# Patient Record
Sex: Female | Born: 1990
Health system: Southern US, Community
[De-identification: ages and names within clinical notes are randomized; demographics above are authoritative.]

## PROBLEM LIST (undated history)

## (undated) DIAGNOSIS — E282 Polycystic ovarian syndrome: Secondary | ICD-10-CM

## (undated) DIAGNOSIS — E039 Hypothyroidism, unspecified: Secondary | ICD-10-CM

## (undated) DIAGNOSIS — T7840XA Allergy, unspecified, initial encounter: Secondary | ICD-10-CM

## (undated) HISTORY — PX: WISDOM TOOTH EXTRACTION: SHX21

## (undated) HISTORY — DX: Allergy, unspecified, initial encounter: T78.40XA

## (undated) HISTORY — DX: Polycystic ovarian syndrome: E28.2

## (undated) HISTORY — DX: Hypothyroidism, unspecified: E03.9

---

## 2017-04-12 ENCOUNTER — Emergency Department: Payer: BLUE CROSS/BLUE SHIELD

## 2017-04-12 ENCOUNTER — Emergency Department
Admission: EM | Admit: 2017-04-12 | Discharge: 2017-04-12 | Disposition: A | Discharge status: Home / Self Care | Payer: BLUE CROSS/BLUE SHIELD | Attending: Emergency Medicine | Admitting: Emergency Medicine

## 2017-04-12 ENCOUNTER — Other Ambulatory Visit: Payer: Self-pay

## 2017-04-12 DIAGNOSIS — R079 Chest pain, unspecified: Secondary | ICD-10-CM | POA: Insufficient documentation

## 2017-04-12 LAB — BASIC METABOLIC PANEL
Anion gap: 8 (ref 5–15)
BUN: 13 mg/dL (ref 6–20)
CO2: 26 mmol/L (ref 22–32)
Calcium: 9.4 mg/dL (ref 8.9–10.3)
Chloride: 105 mmol/L (ref 101–111)
Creatinine, Ser: 0.68 mg/dL (ref 0.44–1.00)
GFR calc Af Amer: >60 mL/min (ref >60)
GFR calc non Af Amer: >60 mL/min (ref >60)
Glucose, Bld: 108 mg/dL — ABNORMAL HIGH (ref 65–99)
Potassium: 3.5 mmol/L (ref 3.5–5.1)
Sodium: 139 mmol/L (ref 135–145)

## 2017-04-12 LAB — CBC
HCT: 40.4 % (ref 35.0–47.0)
Hemoglobin: 14.2 g/dL (ref 12.0–16.0)
MCH: 30.9 pg (ref 26.0–34.0)
MCHC: 35.1 g/dL (ref 32.0–36.0)
MCV: 88 fL (ref 80.0–100.0)
Platelets: 250 10*3/uL (ref 150–440)
RBC: 4.59 MIL/uL (ref 3.80–5.20)
RDW: 12.4 % (ref 11.5–14.5)
WBC: 7.3 10*3/uL (ref 3.6–11.0)

## 2017-04-12 LAB — TROPONIN I: Troponin I: <0.03 ng/mL (ref <0.03)

## 2017-04-12 MED ORDER — DIAZEPAM 5 MG PO TABS: 5.0000 mg | ORAL_TABLET | Freq: Three times a day (TID) | ORAL | 0 refills | Status: DC | PRN

## 2017-04-12 NOTE — ED Triage Notes (Signed)
Patient reports having off/on "funny" feeling in chest for couple days.  Reports tonight with left upper chest pain, described as sore, denies radiation, or shortness of breath or nausea.  Also reports left thigh feels tingling/tense.

## 2017-04-12 NOTE — ED Notes (Signed)
Dr. Mayford Knife at bedside with pt

## 2017-04-12 NOTE — ED Provider Notes (Addendum)
[facility] Emergency Department Provider Note       Time seen: ----------------------------------------- 7:33 AM on 04/12/2017 -----------------------------------------     I have reviewed the triage vital signs and the nursing notes.   HISTORY   Chief Complaint Chest Pain    HPI Cassandra Rojas is a 26 y.o. female who presents to the ED for funny feeling in her chest for several days. Patient reports tonight she had some left upper chest pain that she describes as soreness. She denies any other symptoms such as shortness of breath or nausea. Still felt some tingling in her left thigh. She denies fevers, chills or other complaints.  No past medical history on file.  There are no active problems to display for this patient.   No past surgical history on file.  Allergies Amoxapine and related  Social History Social History  Substance Use Topics  . Smoking status: Not on file  . Smokeless tobacco: Not on file  . Alcohol use Not on file    Review of Systems Constitutional: Negative for fever. Eyes: Negative for vision changes ENT:  Negative for congestion, sore throat Cardiovascular: positive for chest soreness Respiratory: Negative for shortness of breath. Gastrointestinal: Negative for abdominal pain, vomiting and diarrhea. Genitourinary: Negative for dysuria. Musculoskeletal: positive for left thigh soreness Skin: Negative for rash. Neurological: Negative for headaches, focal weakness or numbness.  All systems negative/normal/unremarkable except as stated in the HPI  ____________________________________________   PHYSICAL EXAM:  VITAL SIGNS: ED Triage Vitals  Enc Vitals Group     BP 04/12/17 0427 (!) 135/93     Pulse Rate 04/12/17 0427 94     Resp 04/12/17 0427 14     Temp 04/12/17 0427 98.5 F (36.9 C)     Temp Source 04/12/17 0427 Oral     SpO2 04/12/17 0427 100 %     Weight 04/12/17 0427 117 lb (53.1 kg)      Height 04/12/17 0427  (1.727 m)     Head Circumference --      Peak Flow --      Pain Score 04/12/17 0429 3     Pain Loc --      Pain Edu? --      Excl. in GC? --     Constitutional: Alert and oriented. Well appearing and in no distress. Eyes: Conjunctivae are normal. Normal extraocular movements. ENT   Head: Normocephalic and atraumatic.   Nose: No congestion/rhinnorhea.   Mouth/Throat: Mucous membranes are moist.   Neck: No stridor. Cardiovascular: Normal rate, regular rhythm. No murmurs, rubs, or gallops. Respiratory: Normal respiratory effort without tachypnea nor retractions. Breath sounds are clear and equal bilaterally. No wheezes/rales/rhonchi. Gastrointestinal: Soft and nontender. Normal bowel sounds Musculoskeletal: Nontender with normal range of motion in extremities. No lower extremity tenderness nor edema. Neurologic:  Normal speech and language. No gross focal neurologic deficits are appreciated.  Skin:  Skin is warm, dry and intact. No rash noted. Psychiatric: Mood and affect are normal. Speech and behavior are normal.  ____________________________________________  EKG: Interpreted by me.sinus rhythm with a rate of 90 bpm, normal QRS, normal QT, nonspecific T-wave changes  repeat EKG with sinus rhythm, rate is 71 bpm, short PR interval, normal QRS, normal QT.  ____________________________________________  ED COURSE:  Pertinent labs & imaging results that were available during my care of the patient were reviewed by me and considered in my medical decision making (see chart for details). Patient presents for nonspecific chest pain,  we will assess with labs and imaging as indicated.   Procedures ____________________________________________   LABS (pertinent positives/negatives)  Labs Reviewed  BASIC METABOLIC PANEL - Abnormal; Notable for the following:       Result Value   Glucose, Bld 108 (*)    All other components within normal limits   CBC  TROPONIN I    RADIOLOGY Images were viewed by me  chest x-ray is normal  ____________________________________________  DIFFERENTIAL DIAGNOSIS   chest wall strain, costochondritis, anxiety, GERD, angina   FINAL ASSESSMENT AND PLAN  chest pain   Plan: Patient's labs and imaging were dictated above. Patient had presented for chest pain which seems to be musculoskeletal or anxiety in origin. Her EKG is slightly abnormal but she has no risk factors for heart disease. She will be encouraged to have outpatient follow-up with her doctor and I'll prescribe Valium for anxiety and muscle relaxants.   Emily Filbert, MD   Note: This note was generated in part or whole with voice recognition software. Voice recognition is usually quite accurate but there are transcription errors that can and very often do occur. I apologize for any typographical errors that were not detected and corrected.     Emily Filbert, MD 04/12/17 1610    Emily Filbert, MD 04/12/17 [phone]

## 2018-08-05 IMAGING — CR DG CHEST 2V
1 series · 2 of 2 positions shown · non-contrast
Comparison: None.

CLINICAL DATA: Chest pain

EXAM:
CHEST  2 VIEW

[Series 1: dg chest 2 view · 0.14mm/px · 2 of 2 slices shown]
[im 1/2]
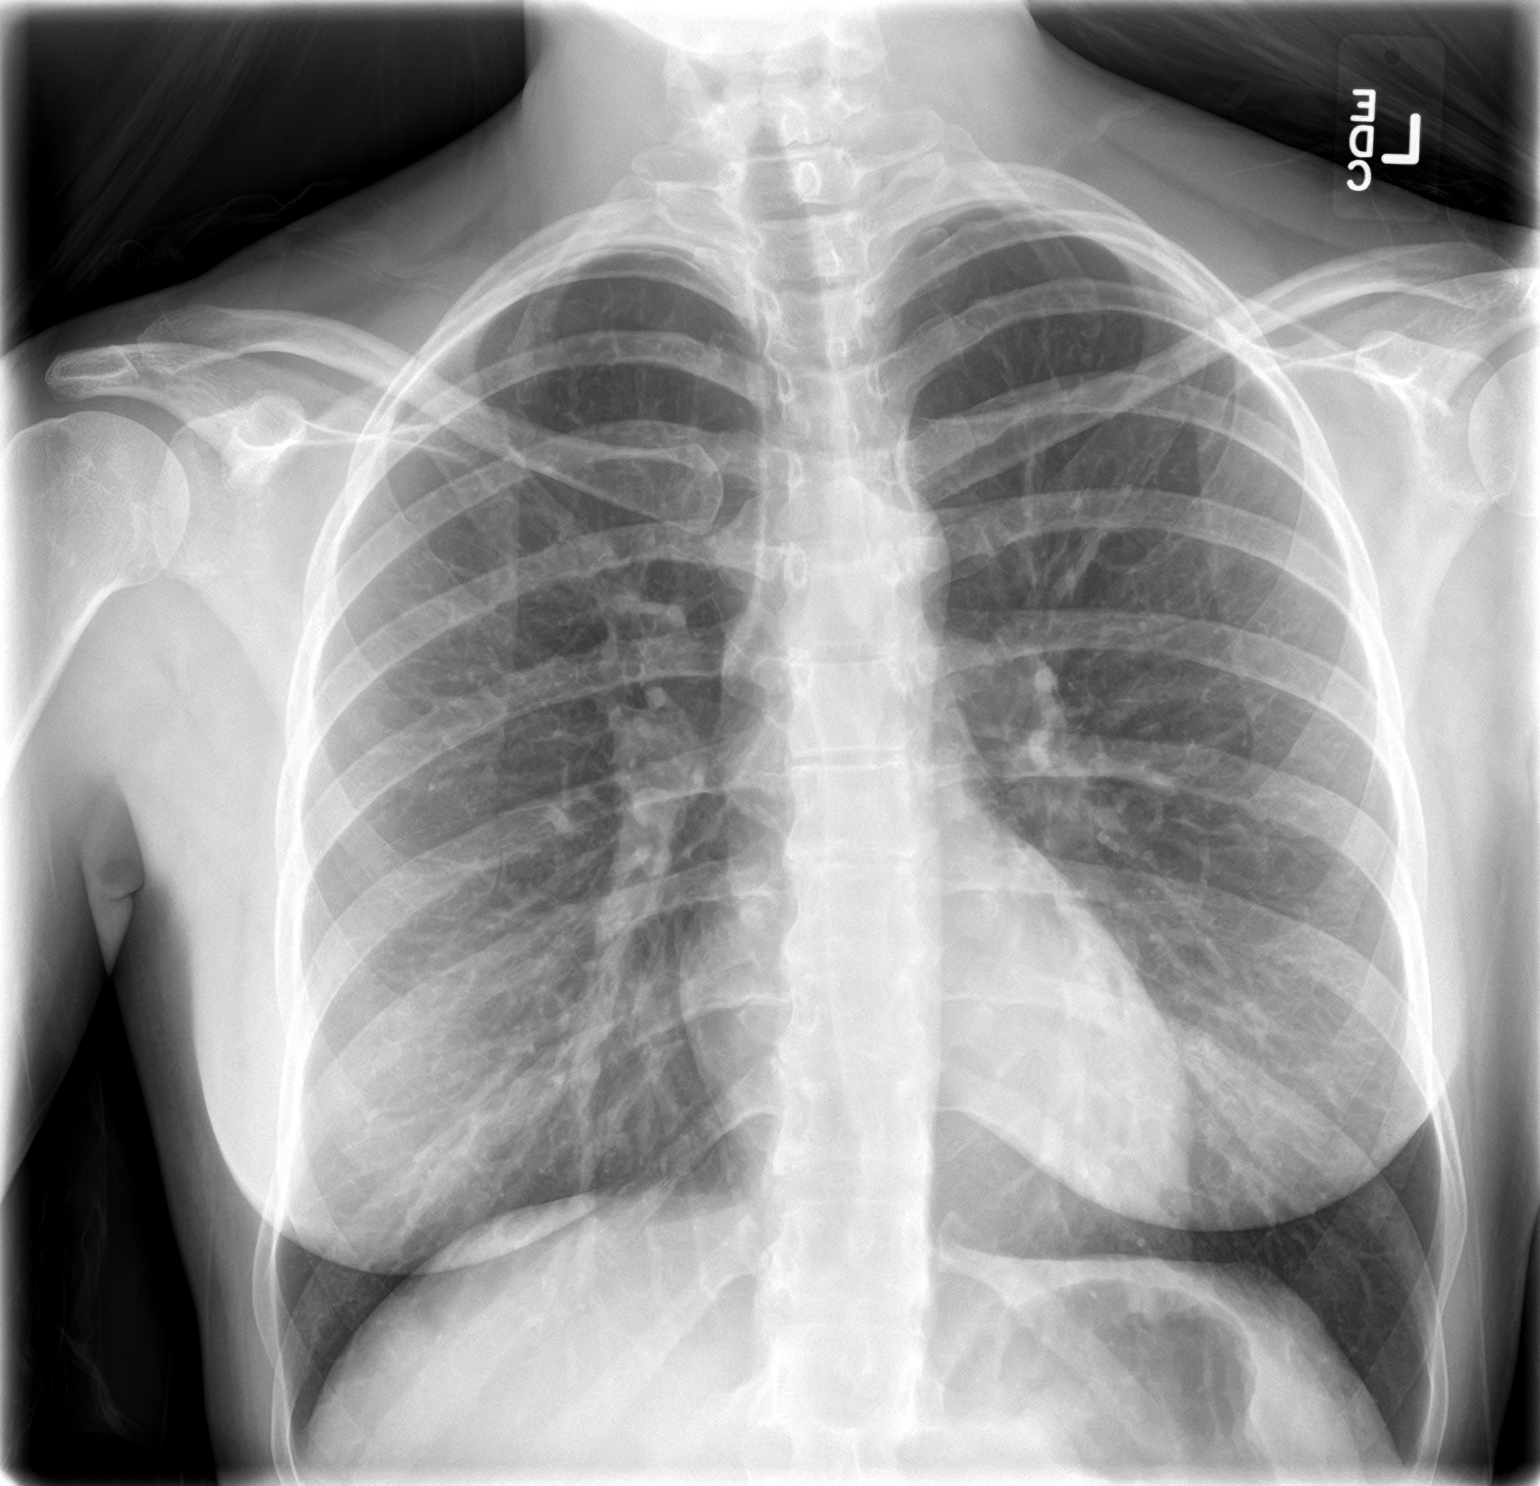
[im 2/2]
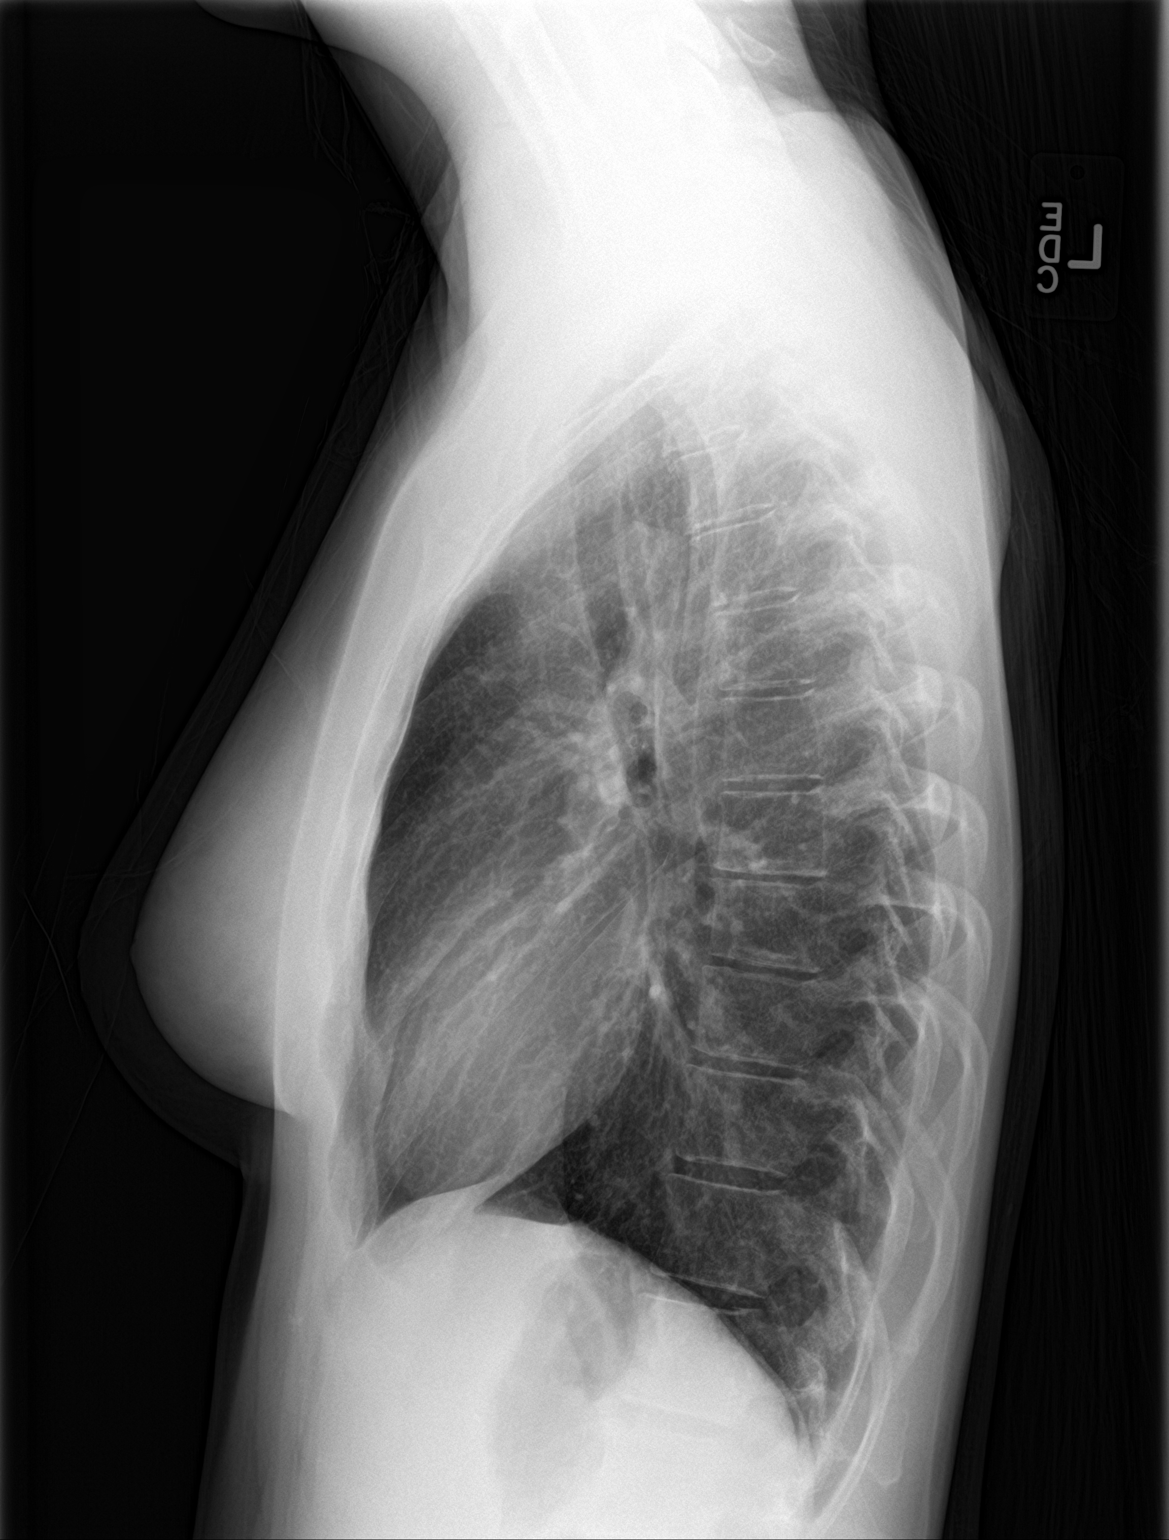

[2 of 2 positions shown; findings below may reference images not displayed]

FINDINGS: The heart size and mediastinal contours are within normal limits.
Both lungs are clear. The visualized skeletal structures are
unremarkable.
IMPRESSION: No active cardiopulmonary disease.

## 2018-09-10 DIAGNOSIS — D229 Melanocytic nevi, unspecified: Secondary | ICD-10-CM | POA: Diagnosis not present

## 2018-09-16 DIAGNOSIS — D2261 Melanocytic nevi of right upper limb, including shoulder: Secondary | ICD-10-CM | POA: Diagnosis not present

## 2018-09-16 DIAGNOSIS — D2262 Melanocytic nevi of left upper limb, including shoulder: Secondary | ICD-10-CM | POA: Diagnosis not present

## 2018-09-16 DIAGNOSIS — R208 Other disturbances of skin sensation: Secondary | ICD-10-CM | POA: Diagnosis not present

## 2018-09-16 DIAGNOSIS — D225 Melanocytic nevi of trunk: Secondary | ICD-10-CM | POA: Diagnosis not present

## 2018-09-16 DIAGNOSIS — D2272 Melanocytic nevi of left lower limb, including hip: Secondary | ICD-10-CM | POA: Diagnosis not present

## 2018-12-09 DIAGNOSIS — Z6822 Body mass index (BMI) 22.0-22.9, adult: Secondary | ICD-10-CM | POA: Diagnosis not present

## 2018-12-09 DIAGNOSIS — Z124 Encounter for screening for malignant neoplasm of cervix: Secondary | ICD-10-CM | POA: Diagnosis not present

## 2018-12-09 DIAGNOSIS — Z130 Encounter for screening for diseases of the blood and blood-forming organs and certain disorders involving the immune mechanism: Secondary | ICD-10-CM | POA: Diagnosis not present

## 2018-12-09 DIAGNOSIS — Z01419 Encounter for gynecological examination (general) (routine) without abnormal findings: Secondary | ICD-10-CM | POA: Diagnosis not present

## 2018-12-09 DIAGNOSIS — Z3044 Encounter for surveillance of vaginal ring hormonal contraceptive device: Secondary | ICD-10-CM | POA: Diagnosis not present

## 2019-05-28 DIAGNOSIS — Z20828 Contact with and (suspected) exposure to other viral communicable diseases: Secondary | ICD-10-CM | POA: Diagnosis not present

## 2019-11-10 ENCOUNTER — Ambulatory Visit (INDEPENDENT_AMBULATORY_CARE_PROVIDER_SITE_OTHER): Payer: BLUE CROSS/BLUE SHIELD | Admitting: Adult Health

## 2019-11-10 ENCOUNTER — Other Ambulatory Visit: Payer: Self-pay

## 2019-11-10 ENCOUNTER — Encounter: Payer: Self-pay | Admitting: Adult Health

## 2019-11-10 VITALS — BP 108/80 | HR 82 | Temp 96.6°F | Resp 16 | Ht 62.0 in | Wt 120.6 lb

## 2019-11-10 DIAGNOSIS — Z8639 Personal history of other endocrine, nutritional and metabolic disease: Secondary | ICD-10-CM | POA: Insufficient documentation

## 2019-11-10 DIAGNOSIS — Z0000 Encounter for general adult medical examination without abnormal findings: Secondary | ICD-10-CM | POA: Diagnosis not present

## 2019-11-10 DIAGNOSIS — E559 Vitamin D deficiency, unspecified: Secondary | ICD-10-CM | POA: Diagnosis not present

## 2019-11-10 DIAGNOSIS — Z91018 Allergy to other foods: Secondary | ICD-10-CM | POA: Diagnosis not present

## 2019-11-10 DIAGNOSIS — G5602 Carpal tunnel syndrome, left upper limb: Secondary | ICD-10-CM

## 2019-11-10 MED ORDER — EPINEPHRINE 0.3 MG/0.3ML IJ SOAJ: 0.3000 mg | INTRAMUSCULAR | 1 refills | Status: DC | PRN

## 2019-11-10 NOTE — Progress Notes (Signed)
New patient visit   Patient: Cassandra Rojas   DOB: Nov 06, 1990   29 y.o. Female  MRN: [id number] Visit Date: 11/10/2019  Today's healthcare provider: Jairo Ben, FNP   Chief Complaint  Patient presents with  . New Patient (Initial Visit)   Subjective    Cassandra Rojas is a 29 y.o. female who presents today as a new patient to establish care.  HPI Patient comes in office today to establish care she states that she has not had a previous PCP before. Patient states that she feels well today but would like to readdress pain in her left wrist and upper arm that has been occurring intermittent for the past year and half.   Patient reports that she has been seen at urgent care for pain before and was diagnosed with carpal tunnel syndrome, patient complains of sharp shooting pain and numbness in her forearm when extending left arm out.  Patient reports that she follows a well balanced diet and is staying active exercising up to 3x a week and reports sleeping fairly well. She has pain in her posterior forearm, and sometimes tingling in her fingers.   She is active in yoga.  She also does [employer] however she had this before.  Denies any injury or known trauma.   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  Patient reports that she sees [facility] for her gynecology exams and reports that her last pap was normal (2020), no history of abnormal. She had breast exam at her gynecology.   She had Covid Mcderma immunization, and she had a small skin lump near her left side of neck. She feels as if it has resolved. She has had both vaccine. Last dose was April 1st.    She has a nut allergy to cashews, no anaphylaxis but has had throat itching. Mouth feels on fire. She did have some mild throat swelling in the past.   Denies any presyncope or syncopal. She has had diagnosis of PCOS in past. She has been on oral birth control. She has been on thyroid  medication in past and stopped it . She has not has not had a PCP since college.  Patient  denies any fever, body aches,chills, rash, chest pain, shortness of breath, nausea, vomiting, or diarrhea.   Declines STD testing or labs for STD's.  Past Medical History:  Diagnosis Date  . Allergy   . Hypothyroid   . PCOS (polycystic ovarian syndrome)    Family Status  Relation Name Status  . Mother  Alive  . Father  Alive  . Brother  Alive  . PGM  (Not Specified)  . PGF  (Not Specified)   Family History  Problem Relation Age of Onset  . Cataracts Mother   . Hypercholesterolemia Father   . Hypertension Father   . Allergies Brother   . Heart disease Paternal Grandmother   . Heart disease Paternal Grandfather   . Parkinson's disease Paternal Grandfather    Social History   Socioeconomic History  . Marital status: Married    Spouse name: Not on file  . Number of children: Not on file  . Years of education: Not on file  . Highest education level: Not on file  Occupational History  . Not on file  Tobacco Use  . Smoking status: Never Smoker  . Smokeless tobacco: Never Used  Substance and Sexual Activity  . Alcohol use: Yes  Alcohol/week: 4.0 standard drinks    Types: 1 Glasses of wine, 2 Cans of beer, 1 Shots of liquor per week  . Drug use: Never  . Sexual activity: Yes    Partners: Male    Birth control/protection: Other-see comments    Comment: NuvaRing  Other Topics Concern  . Not on file  Social History Narrative  . Not on file   Social Determinants of Health   Financial Resource Strain:   . Difficulty of Paying Living Expenses:   Food Insecurity:   . Worried About Programme researcher, broadcasting/film/videounning Out of Food in the Last Year:   . Baristaan Out of Food in the Last Year:   Transportation Needs:   . Freight forwarderLack of Transportation (Medical):   Marland Kitchen. Lack of Transportation (Non-Medical):   Physical Activity:   . Days of Exercise per Week:   . Minutes of Exercise per Session:   Stress:   . Feeling of  Stress :   Social Connections:   . Frequency of Communication with Friends and Family:   . Frequency of Social Gatherings with Friends and Family:   . Attends Religious Services:   . Active Member of Clubs or Organizations:   . Attends BankerClub or Organization Meetings:   Marland Kitchen. Marital Status:    Outpatient Medications Prior to Visit  Medication Sig  . etonogestrel-ethinyl estradiol (NUVARING) 0.12-0.015 MG/24HR vaginal ring Place 1 each vaginally every 28 (twenty-eight) days. Insert vaginally and leave in place for 3 consecutive weeks, then remove for 1 week.  . [DISCONTINUED] diazepam (VALIUM) 5 MG tablet Take 1 tablet (5 mg total) by mouth every 8 (eight) hours as needed for muscle spasms.   No facility-administered medications prior to visit.   Allergies  Allergen Reactions  . Other Cough    Mold* Cashews & pistachios patient reports swelling of throat  . Amoxapine And Related Other (See Comments)    Unsure of reaction, occurred as a child  . Amoxicillin     Patient reports childhood allergy     There is no immunization history on file for this patient.  Health Maintenance  Topic Date Due  . HIV Screening  Never done  . TETANUS/TDAP  Never done  . PAP SMEAR-Modifier  Never done  . INFLUENZA VACCINE  02/05/2020    Patient Care Team: Berniece PapFlinchum, Lemarcus Baggerly S, FNP as PCP - General (Family Medicine)  Review of Systems  Musculoskeletal: Positive for myalgias and neck pain.  Allergic/Immunologic: Positive for environmental allergies and food allergies.  Neurological: Positive for numbness.  All other systems reviewed and are negative.  Last CBC Lab Results  Component Value Date   WBC 7.3 04/12/2017   HGB 14.2 04/12/2017   HCT 40.4 04/12/2017   MCV 88.0 04/12/2017   MCH 30.9 04/12/2017   RDW 12.4 04/12/2017   PLT 250 04/12/2017   Last metabolic panel Lab Results  Component Value Date   GLUCOSE 108 (H) 04/12/2017   NA 139 04/12/2017   K 3.5 04/12/2017   CL 105  04/12/2017   CO2 26 04/12/2017   BUN 13 04/12/2017   CREATININE 0.68 04/12/2017   GFRNONAA >60 04/12/2017   GFRAA >60 04/12/2017   CALCIUM 9.4 04/12/2017   ANIONGAP 8 04/12/2017   Last lipids No results found for: CHOL, HDL, LDLCALC, LDLDIRECT, TRIG, CHOLHDL Last hemoglobin A1c No results found for: HGBA1C Last thyroid functions No results found for: TSH, T3TOTAL, T4TOTAL, THYROIDAB Last vitamin D No results found for: 25OHVITD2, 25OHVITD3, VD25OH Last vitamin B12 and Folate  No results found for: VITAMINB12, FOLATE    Objective    BP 108/80   Pulse 82   Temp (!) 96.6 F (35.9 C) (Oral)   Resp 16   Ht 5\' 2"  (1.575 m)   Wt 120 lb 9.6 oz (54.7 kg)   SpO2 98%   BMI 22.06 kg/m  Physical Exam Vitals reviewed.  Constitutional:      General: She is not in acute distress.    Appearance: Normal appearance. She is well-developed and normal weight. She is not ill-appearing, toxic-appearing or diaphoretic.     Interventions: She is not intubated. HENT:     Head: Normocephalic and atraumatic.     Right Ear: Tympanic membrane, ear canal and external ear normal.     Left Ear: Tympanic membrane, ear canal and external ear normal.     Nose: Nose normal.     Mouth/Throat:     Mouth: Mucous membranes are moist.     Pharynx: No oropharyngeal exudate or posterior oropharyngeal erythema.  Eyes:     General: Lids are normal. No scleral icterus.       Right eye: No discharge.        Left eye: No discharge.     Conjunctiva/sclera: Conjunctivae normal.     Right eye: Right conjunctiva is not injected. No exudate or hemorrhage.    Left eye: Left conjunctiva is not injected. No exudate or hemorrhage.    Pupils: Pupils are equal, round, and reactive to light.  Neck:     Thyroid: No thyroid mass or thyromegaly.     Vascular: Normal carotid pulses. No carotid bruit, hepatojugular reflux or JVD.     Trachea: Trachea and phonation normal. No tracheal tenderness or tracheal deviation.      Meningeal: Brudzinski's sign and Kernig's sign absent.  Cardiovascular:     Rate and Rhythm: Normal rate and regular rhythm.     Pulses: Normal pulses.          Radial pulses are 2+ on the right side and 2+ on the left side.       Dorsalis pedis pulses are 2+ on the right side and 2+ on the left side.       Posterior tibial pulses are 2+ on the right side and 2+ on the left side.     Heart sounds: Normal heart sounds, S1 normal and S2 normal. Heart sounds not distant. No murmur. No friction rub. No gallop.   Pulmonary:     Effort: Pulmonary effort is normal. No tachypnea, bradypnea, accessory muscle usage or respiratory distress. She is not intubated.     Breath sounds: Normal breath sounds. No stridor. No wheezing, rhonchi or rales.  Chest:     Chest wall: No tenderness.  Abdominal:     General: Bowel sounds are normal. There is no distension or abdominal bruit.     Palpations: Abdomen is soft. There is no shifting dullness, fluid wave, hepatomegaly, splenomegaly, mass or pulsatile mass.     Tenderness: There is no abdominal tenderness. There is no right CVA tenderness, left CVA tenderness, guarding or rebound.     Hernia: No hernia is present.  Musculoskeletal:        General: No tenderness or deformity. Normal range of motion.     Right wrist: No tenderness. Normal range of motion.     Left wrist: No tenderness. Normal range of motion.     Cervical back: Full passive range of motion without pain, normal range of  motion and neck supple. No edema, erythema or rigidity. No spinous process tenderness or muscular tenderness. Normal range of motion.     Comments: Positive tinel's and Phalen test.   Lymphadenopathy:     Head:     Right side of head: No submental, submandibular, tonsillar, preauricular, posterior auricular or occipital adenopathy.     Left side of head: No submental, submandibular, tonsillar, preauricular, posterior auricular or occipital adenopathy.     Cervical: No cervical  adenopathy.     Right cervical: No superficial, deep or posterior cervical adenopathy.    Left cervical: No superficial, deep or posterior cervical adenopathy.     Upper Body:     Right upper body: No supraclavicular or pectoral adenopathy.     Left upper body: No supraclavicular or pectoral adenopathy.  Skin:    General: Skin is warm and dry.     Capillary Refill: Capillary refill takes less than 2 seconds.     Coloration: Skin is not jaundiced or pale.     Findings: No abrasion, bruising, burn, ecchymosis, erythema, lesion, petechiae or rash.     Nails: There is no clubbing.  Neurological:     Mental Status: She is alert and oriented to person, place, and time.     GCS: GCS eye subscore is 4. GCS verbal subscore is 5. GCS motor subscore is 6.     Cranial Nerves: No cranial nerve deficit.     Sensory: No sensory deficit.     Motor: No tremor, atrophy, abnormal muscle tone or seizure activity.     Coordination: Coordination normal.     Gait: Gait normal.     Deep Tendon Reflexes: Reflexes are normal and symmetric. Reflexes normal. Babinski sign absent on the right side. Babinski sign absent on the left side.     Reflex Scores:      Tricep reflexes are 2+ on the right side and 2+ on the left side.      Bicep reflexes are 2+ on the right side and 2+ on the left side.      Brachioradialis reflexes are 2+ on the right side and 2+ on the left side.      Patellar reflexes are 2+ on the right side and 2+ on the left side.      Achilles reflexes are 2+ on the right side and 2+ on the left side. Psychiatric:        Mood and Affect: Mood normal.        Speech: Speech normal.        Behavior: Behavior normal.        Thought Content: Thought content normal.        Judgment: Judgment normal.    Patient appers well, not sickly. Speaking in complete sentences. Patient moves on and off of exam table and in room without difficulty. Gait is normal in hall and in room. Patient is oriented to person  place time and situation. Patient answers questions appropriately and engages eye contact and verbal dialect with provider.  Depression Screen PHQ 2/9 Scores 11/10/2019  PHQ - 2 Score 2  PHQ- 9 Score 9   No results found for any visits on 11/10/19.  Assessment & Plan     Routine health maintenance - Plan: CBC with Differential/Platelet, Comprehensive metabolic panel, TSH, Lipid panel, POCT urinalysis dipstick  History of hypothyroidism  Allergy to cashew nut - Plan: EPINEPHrine 0.3 mg/0.3 mL IJ SOAJ injection  Allergy to nuts - Plan: EPINEPHrine 0.3  mg/0.3 mL IJ SOAJ injection  Vitamin D deficiency - Plan: VITAMIN D 25 Hydroxy (Vit-D Deficiency, Fractures)  Carpal tunnel syndrome of left wrist  Meds ordered this encounter  Medications  . EPINEPHrine 0.3 mg/0.3 mL IJ SOAJ injection    Sig: Inject 0.3 mLs (0.3 mg total) into the muscle as needed for anaphylaxis.    Dispense:  1 each    Refill:  1     Discussed carpal tunnel stretches, brace at night and can refer to hand specialist. She prefers conservative measures at this time and will try the above and NSAID as needed per package.   Return in 1 year (on 11/09/2020), or if symptoms worsen or fail to improve, for at any time for any worsening symptoms, Go to Emergency room/ urgent care if worse.  Advised patient call the office or your primary care doctor for an appointment if no improvement within 72 hours or if any symptoms change or worsen at any time  Advised ER or urgent Care if after hours or on weekend. Call 911 for emergency symptoms at any time.Patinet verbalized understanding of all instructions given/reviewed and treatment plan and has no further questions or concerns at this time.    Fasting labs.  Epinephrine pen discussed and signs of anaphylaxis  How to use  and calling 911 if used advised.  Notify office if used. Will have pharmacist demonstrate as well when she picks up medication. Epinephrine -pen video discussed.   Return if symptoms worsen or fail to improve.    Advised patient call the office or your primary care doctor for an appointment if no improvement within 72 hours or if any symptoms change or worsen at any time  Advised ER or urgent Care if after hours or on weekend. Call 911 for emergency symptoms at any time.Patinet verbalized understanding of all instructions given/reviewed and treatment plan and has no further questions or concerns at this time.    IBeverely Pace Maryelizabeth Eberle, FNP, have reviewed all documentation for this visit. The documentation on 11/11/19 for the exam, diagnosis, procedures, and orders are all accurate and complete.   Jairo Ben, FNP  [facility] [phone] (phone) [phone] (fax)  [facility]

## 2019-11-10 NOTE — Patient Instructions (Addendum)
Health Maintenance, Female Adopting a healthy lifestyle and getting preventive care are important in promoting health and wellness. Ask your health care provider about:  The right schedule for you to have regular tests and exams.  Things you can do on your own to prevent diseases and keep yourself healthy. What should I know about diet, weight, and exercise? Eat a healthy diet   Eat a diet that includes plenty of vegetables, fruits, low-fat dairy products, and lean protein.  Do not eat a lot of foods that are high in solid fats, added sugars, or sodium. Maintain a healthy weight Body mass index (BMI) is used to identify weight problems. It estimates body fat based on height and weight. Your health care provider can help determine your BMI and help you achieve or maintain a healthy weight. Get regular exercise Get regular exercise. This is one of the most important things you can do for your health. Most adults should:  Exercise for at least 150 minutes each week. The exercise should increase your heart rate and make you sweat (moderate-intensity exercise).  Do strengthening exercises at least twice a week. This is in addition to the moderate-intensity exercise.  Spend less time sitting. Even light physical activity can be beneficial. Watch cholesterol and blood lipids Have your blood tested for lipids and cholesterol at 31 years of age, then have this test every 5 years. Have your cholesterol levels checked more often if:  Your lipid or cholesterol levels are high.  You are older than 31 years of age.  You are at high risk for heart disease. What should I know about cancer screening? Depending on your health history and family history, you may need to have cancer screening at various ages. This may include screening for:  Breast cancer.  Cervical cancer.  Colorectal cancer.  Skin cancer.  Lung cancer. What should I know about heart disease, diabetes, and high blood  pressure? Blood pressure and heart disease  High blood pressure causes heart disease and increases the risk of stroke. This is more likely to develop in people who have high blood pressure readings, are of African descent, or are overweight.  Have your blood pressure checked: ? Every 3-5 years if you are 18-39 years of age. ? Every year if you are 40 years old or older. Diabetes Have regular diabetes screenings. This checks your fasting blood sugar level. Have the screening done:  Once every three years after age 40 if you are at a normal weight and have a low risk for diabetes.  More often and at a younger age if you are overweight or have a high risk for diabetes. What should I know about preventing infection? Hepatitis B If you have a higher risk for hepatitis B, you should be screened for this virus. Talk with your health care provider to find out if you are at risk for hepatitis B infection. Hepatitis C Testing is recommended for:  Everyone born from 1945 through 1965.  Anyone with known risk factors for hepatitis C. Sexually transmitted infections (STIs)  Get screened for STIs, including gonorrhea and chlamydia, if: ? You are sexually active and are younger than 31 years of age. ? You are older than 31 years of age and your health care provider tells you that you are at risk for this type of infection. ? Your sexual activity has changed since you were last screened, and you are at increased risk for chlamydia or gonorrhea. Ask your health care provider if   you are at risk.  Ask your health care provider about whether you are at high risk for HIV. Your health care provider may recommend a prescription medicine to help prevent HIV infection. If you choose to take medicine to prevent HIV, you should first get tested for HIV. You should then be tested every 3 months for as long as you are taking the medicine. Pregnancy  If you are about to stop having your period (premenopausal) and  you may become pregnant, seek counseling before you get pregnant.  Take 400 to 800 micrograms (mcg) of folic acid every day if you become pregnant.  Ask for birth control (contraception) if you want to prevent pregnancy. Osteoporosis and menopause Osteoporosis is a disease in which the bones lose minerals and strength with aging. This can result in bone fractures. If you are 29 years old or older, or if you are at risk for osteoporosis and fractures, ask your health care provider if you should:  Be screened for bone loss.  Take a calcium or vitamin D supplement to lower your risk of fractures.  Be given hormone replacement therapy (HRT) to treat symptoms of menopause. Follow these instructions at home: Lifestyle  Do not use any products that contain nicotine or tobacco, such as cigarettes, e-cigarettes, and chewing tobacco. If you need help quitting, ask your health care provider.  Do not use street drugs.  Do not share needles.  Ask your health care provider for help if you need support or information about quitting drugs. Alcohol use  Do not drink alcohol if: ? Your health care provider tells you not to drink. ? You are pregnant, may be pregnant, or are planning to become pregnant.  If you drink alcohol: ? Limit how much you use to 0-1 drink a day. ? Limit intake if you are breastfeeding.  Be aware of how much alcohol is in your drink. In the [place] one drink equals one 12 oz bottle of beer (355 mL), one 5 oz glass of wine (148 mL), or one 1 oz glass of hard liquor (44 mL). General instructions  Schedule regular health, dental, and eye exams.  Stay current with your vaccines.  Tell your health care provider if: ? You often feel depressed. ? You have ever been abused or do not feel safe at home. Summary  Adopting a healthy lifestyle and getting preventive care are important in promoting health and wellness.  Follow your health care provider's instructions about healthy  diet, exercising, and getting tested or screened for diseases.  Follow your health care provider's instructions on monitoring your cholesterol and blood pressure. This information is not intended to replace advice given to you by your health care provider. Make sure you discuss any questions you have with your health care provider. Document Revised: 06/16/2018 Document Reviewed: 06/16/2018 Elsevier Patient Education  2020 [employer]  Breast Self-Awareness Breast self-awareness is knowing how your breasts look and feel. Doing breast self-awareness is important. It allows you to catch a breast problem early while it is still small and can be treated. All women should do breast self-awareness, including women who have had breast implants. Tell your doctor if you notice a change in your breasts. What you need:  A mirror.  A well-lit room. How to do a breast self-exam A breast self-exam is one way to learn what is normal for your breasts and to check for changes. To do a breast self-exam: Look for changes  1. Take off all the  clothes above your waist. 2. Stand in front of a mirror in a room with good lighting. 3. Put your hands on your hips. 4. Push your hands down. 5. Look at your breasts and nipples in the mirror to see if one breast or nipple looks different from the other. Check to see if: ? The shape of one breast is different. ? The size of one breast is different. ? There are wrinkles, dips, and bumps in one breast and not the other. 6. Look at each breast for changes in the skin, such as: ? Redness. ? Scaly areas. 7. Look for changes in your nipples, such as: ? Liquid around the nipples. ? Bleeding. ? Dimpling. ? Redness. ? A change in where the nipples are. Feel for changes  1. Lie on your back on the floor. 2. Feel each breast. To do this, follow these steps: ? Pick a breast to feel. ? Put the arm closest to that breast above your head. ? Use your other arm to feel  the nipple area of your breast. Feel the area with the pads of your three middle fingers by making small circles with your fingers. For the first circle, press lightly. For the second circle, press harder. For the third circle, press even harder. ? Keep making circles with your fingers at the different pressures as you move down your breast. Stop when you feel your ribs. ? Move your fingers a little toward the center of your body. ? Start making circles with your fingers again, this time going up until you reach your collarbone. ? Keep making up-and-down circles until you reach your armpit. Remember to keep using the three pressures. ? Feel the other breast in the same way. 3. Sit or stand in the tub or shower. 4. With soapy water on your skin, feel each breast the same way you did in step 2 when you were lying on the floor. Write down what you find Writing down what you find can help you remember what to tell your doctor. Write down:  What is normal for each breast.  Any changes you find in each breast, including: ? The kind of changes you find. ? Whether you have pain. ? Size and location of any lumps.  When you last had your menstrual period. General tips  Check your breasts every month.  If you are breastfeeding, the best time to check your breasts is after you feed your baby or after you use a breast pump.  If you get menstrual periods, the best time to check your breasts is 5-7 days after your menstrual period is over.  With time, you will become comfortable with the self-exam, and you will begin to know if there are changes in your breasts. Contact a doctor if you:  See a change in the shape or size of your breasts or nipples.  See a change in the skin of your breast or nipples, such as red or scaly skin.  Have fluid coming from your nipples that is not normal.  Find a lump or thick area that was not there before.  Have pain in your breasts.  Have any concerns about  your breast health. Summary  Breast self-awareness includes looking for changes in your breasts, as well as feeling for changes within your breasts.  Breast self-awareness should be done in front of a mirror in a well-lit room.  You should check your breasts every month. If you get menstrual periods, the best time  to check your breasts is 5-7 days after your menstrual period is over.  Let your doctor know of any changes you see in your breasts, including changes in size, changes on the skin, pain or tenderness, or fluid from your nipples that is not normal. This information is not intended to replace advice given to you by your health care provider. Make sure you discuss any questions you have with your health care provider. Document Revised: 02/09/2018 Document Reviewed: 02/09/2018 Elsevier Patient Education  [place] tunnel syndrome is a condition that causes pain in your hand and arm. The carpal tunnel is a narrow area that is on the palm side of your wrist. Repeated wrist motion or certain diseases may cause swelling in the tunnel. This swelling can pinch the main nerve in the wrist (median nerve). What are the causes? This condition may be caused by:  Repeated wrist motions.  Wrist injuries.  Arthritis.  A sac of fluid (cyst) or abnormal growth (tumor) in the carpal tunnel.  Fluid buildup during pregnancy. Sometimes the cause is not known. What increases the risk? The following factors may make you more likely to develop this condition:  Having a job in which you move your wrist in the same way many times. This includes jobs like being a Software engineer or a Scientist, water quality.  Being a woman.  Having other health conditions, such as: ? Diabetes. ? Obesity. ? A thyroid gland that is not active enough (hypothyroidism). ? Kidney failure. What are the signs or symptoms? Symptoms of this condition include:  A tingling feeling in your  fingers.  Tingling or a loss of feeling (numbness) in your hand.  Pain in your entire arm. This pain may get worse when you bend your wrist and elbow for a long time.  Pain in your wrist that goes up your arm to your shoulder.  Pain that goes down into your palm or fingers.  A weak feeling in your hands. You may find it hard to grab and hold items. You may feel worse at night. How is this diagnosed? This condition is diagnosed with a medical history and physical exam. You may also have tests, such as:  Electromyogram (EMG). This test checks the signals that the nerves send to the muscles.  Nerve conduction study. This test checks how well signals pass through your nerves.  Imaging tests, such as X-rays, ultrasound, and MRI. These tests check for what might be the cause of your condition. How is this treated? This condition may be treated with:  Lifestyle changes. You will be asked to stop or change the activity that caused your problem.  Doing exercise and activities that make bones and muscles stronger (physical therapy).  Learning how to use your hand again (occupational therapy).  Medicines for pain and swelling (inflammation). You may have injections in your wrist.  A wrist splint.  Surgery. Follow these instructions at home: If you have a splint:  Wear the splint as told by your doctor. Remove it only as told by your doctor.  Loosen the splint if your fingers: ? Tingle. ? Lose feeling (become numb). ? Turn cold and blue.  Keep the splint clean.  If the splint is not waterproof: ? Do not let it get wet. ? Cover it with a watertight covering when you take a bath or a shower. Managing pain, stiffness, and swelling   If told, put ice on the painful area: ? If you have  a removable splint, remove it as told by your doctor. ? Put ice in a plastic bag. ? Place a towel between your skin and the bag. ? Leave the ice on for 20 minutes, 2-3 times per day. General  instructions  Take over-the-counter and prescription medicines only as told by your doctor.  Rest your wrist from any activity that may cause pain. If needed, talk with your boss at work about changes that can help your wrist heal.  Do any exercises as told by your doctor, physical therapist, or occupational therapist.  Keep all follow-up visits as told by your doctor. This is important. Contact a doctor if:  You have new symptoms.  Medicine does not help your pain.  Your symptoms get worse. Get help right away if:  You have very bad numbness or tingling in your wrist or hand. Summary  Carpal tunnel syndrome is a condition that causes pain in your hand and arm.  It is often caused by repeated wrist motions.  Lifestyle changes and medicines are used to treat this problem. Surgery may help in very bad cases.  Follow your doctor's instructions about wearing a splint, resting your wrist, keeping follow-up visits, and calling for help. This information is not intended to replace advice given to you by your health care provider. Make sure you discuss any questions you have with your health care provider. Document Revised: 10/30/2017 Document Reviewed: 10/30/2017 Elsevier Patient Education  2020 [employer].

## 2019-11-11 ENCOUNTER — Encounter: Payer: Self-pay | Admitting: Adult Health

## 2019-11-11 DIAGNOSIS — G5602 Carpal tunnel syndrome, left upper limb: Secondary | ICD-10-CM | POA: Insufficient documentation

## 2019-11-25 DIAGNOSIS — E785 Hyperlipidemia, unspecified: Secondary | ICD-10-CM | POA: Diagnosis not present

## 2019-11-25 DIAGNOSIS — Z0000 Encounter for general adult medical examination without abnormal findings: Secondary | ICD-10-CM | POA: Diagnosis not present

## 2019-11-25 DIAGNOSIS — E559 Vitamin D deficiency, unspecified: Secondary | ICD-10-CM | POA: Diagnosis not present

## 2019-11-26 LAB — COMPREHENSIVE METABOLIC PANEL
ALT: 20 IU/L (ref 0–32)
AST: 20 IU/L (ref 0–40)
Albumin/Globulin Ratio: 2 (ref 1.2–2.2)
Albumin: 4.5 g/dL (ref 3.8–4.8)
Alkaline Phosphatase: 46 IU/L — ABNORMAL LOW (ref 48–121)
BUN/Creatinine Ratio: 16 (ref 9–23)
BUN: 14 mg/dL (ref 6–20)
Bilirubin Total: 0.8 mg/dL (ref 0.0–1.2)
CO2: 22 mmol/L (ref 20–29)
Calcium: 9.4 mg/dL (ref 8.7–10.2)
Chloride: 101 mmol/L (ref 96–106)
Creatinine, Ser: 0.88 mg/dL (ref 0.57–1.00)
GFR calc Af Amer: 101 mL/min/{1.73_m2} (ref >59)
GFR calc non Af Amer: 88 mL/min/{1.73_m2} (ref >59)
Globulin, Total: 2.2 g/dL (ref 1.5–4.5)
Glucose: 88 mg/dL (ref 65–99)
Potassium: 4.2 mmol/L (ref 3.5–5.2)
Sodium: 138 mmol/L (ref 134–144)
Total Protein: 6.7 g/dL (ref 6.0–8.5)

## 2019-11-26 LAB — CBC WITH DIFFERENTIAL/PLATELET
Basophils Absolute: 0 10*3/uL (ref 0.0–0.2)
Basos: 1 %
EOS (ABSOLUTE): 0.2 10*3/uL (ref 0.0–0.4)
Eos: 4 %
Hematocrit: 41.6 % (ref 34.0–46.6)
Hemoglobin: 14 g/dL (ref 11.1–15.9)
Immature Grans (Abs): 0 10*3/uL (ref 0.0–0.1)
Immature Granulocytes: 0 %
Lymphocytes Absolute: 1.9 10*3/uL (ref 0.7–3.1)
Lymphs: 44 %
MCH: 31.2 pg (ref 26.6–33.0)
MCHC: 33.7 g/dL (ref 31.5–35.7)
MCV: 93 fL (ref 79–97)
Monocytes Absolute: 0.5 10*3/uL (ref 0.1–0.9)
Monocytes: 12 %
Neutrophils Absolute: 1.7 10*3/uL (ref 1.4–7.0)
Neutrophils: 39 %
Platelets: 248 10*3/uL (ref 150–450)
RBC: 4.49 x10E6/uL (ref 3.77–5.28)
RDW: 11.8 % (ref 11.7–15.4)
WBC: 4.3 10*3/uL (ref 3.4–10.8)

## 2019-11-26 LAB — LIPID PANEL
Chol/HDL Ratio: 2.4 ratio (ref 0.0–4.4)
Cholesterol, Total: 209 mg/dL — ABNORMAL HIGH (ref 100–199)
HDL: 88 mg/dL (ref >39)
LDL Chol Calc (NIH): 107 mg/dL — ABNORMAL HIGH (ref 0–99)
Triglycerides: 79 mg/dL (ref 0–149)
VLDL Cholesterol Cal: 14 mg/dL (ref 5–40)

## 2019-11-26 LAB — TSH: TSH: 4.83 u[IU]/mL — ABNORMAL HIGH (ref 0.450–4.500)

## 2019-11-26 LAB — VITAMIN D 25 HYDROXY (VIT D DEFICIENCY, FRACTURES): Vit D, 25-Hydroxy: 70.7 ng/mL (ref 30.0–100.0)

## 2019-11-28 ENCOUNTER — Encounter: Payer: Self-pay | Admitting: Adult Health

## 2019-11-28 ENCOUNTER — Telehealth: Payer: Self-pay

## 2019-11-28 NOTE — Telephone Encounter (Signed)
-----   Message from Berniece Pap, FNP sent at 11/28/2019 10:16 AM EDT ----- CBC within normal limits no anemia or signs of infection.  CMP within normal limits, kidney liver function and electrolytes.   TSH shows patient is hypothyroid, advise starting Synthroid. I will send information in MyChart as well regarding medication. Will need recheck TSH and follow up office visit in 3 months.   Vitamin D level is within normal limits.   Total cholesterol and LDL elevated.  Discuss lifestyle modification with patient e.g. increase exercise, fiber, fruits, vegetables, lean meat, and omega 3/fish intake and decrease saturated fat.

## 2019-11-28 NOTE — Progress Notes (Signed)
CBC within normal limits no anemia or signs of infection.  CMP within normal limits, kidney liver function and electrolytes.   TSH shows patient is hypothyroid, advise starting Synthroid. I will send information in MyChart as well regarding medication. Will need recheck TSH and follow up office visit in 3 months.   Vitamin D level is within normal limits.   Total cholesterol and LDL elevated.  Discuss lifestyle modification with patient e.g. increase exercise, fiber, fruits, vegetables, lean meat, and omega 3/fish intake and decrease saturated fat.

## 2019-11-28 NOTE — Telephone Encounter (Signed)
Left message for patient to call office back, okay for [facility] triage to advise patient of result message. KW

## 2019-11-28 NOTE — Telephone Encounter (Signed)
Pt returned call, result note read, verbalizes understanding. Pt states she will need to check her schedule and CB to schedule f/u appt in 3 months.

## 2019-11-29 ENCOUNTER — Other Ambulatory Visit: Payer: Self-pay | Admitting: Adult Health

## 2019-11-29 DIAGNOSIS — E039 Hypothyroidism, unspecified: Secondary | ICD-10-CM

## 2019-11-29 MED ORDER — LEVOTHYROXINE SODIUM 50 MCG PO TABS: 50.0000 ug | ORAL_TABLET | Freq: Every day | ORAL | 0 refills | Status: DC

## 2019-11-29 NOTE — Progress Notes (Signed)
.   Meds ordered this encounter  Medications  . levothyroxine (SYNTHROID) 50 MCG tablet    Sig: Take 1 tablet (50 mcg total) by mouth daily.    Dispense:  90 tablet    Refill:  0   Hypothyroidism, unspecified type

## 2020-01-16 ENCOUNTER — Other Ambulatory Visit: Payer: Self-pay | Admitting: Adult Health

## 2020-01-16 DIAGNOSIS — E7800 Pure hypercholesterolemia, unspecified: Secondary | ICD-10-CM | POA: Insufficient documentation

## 2020-01-16 DIAGNOSIS — E785 Hyperlipidemia, unspecified: Secondary | ICD-10-CM | POA: Insufficient documentation

## 2020-01-16 NOTE — Progress Notes (Unsigned)
Hyperlipidemia, unspecified hyperlipidemia type  Elevated LDL cholesterol level

## 2020-02-28 ENCOUNTER — Other Ambulatory Visit: Payer: Self-pay | Admitting: Adult Health

## 2020-03-02 ENCOUNTER — Ambulatory Visit: Payer: BLUE CROSS/BLUE SHIELD | Admitting: Adult Health

## 2020-03-06 ENCOUNTER — Ambulatory Visit: Payer: BLUE CROSS/BLUE SHIELD | Admitting: Adult Health

## 2020-03-29 ENCOUNTER — Ambulatory Visit: Payer: BLUE CROSS/BLUE SHIELD | Admitting: Adult Health

## 2020-03-29 ENCOUNTER — Other Ambulatory Visit: Payer: Self-pay

## 2020-03-29 ENCOUNTER — Encounter: Payer: Self-pay | Admitting: Adult Health

## 2020-03-29 VITALS — BP 125/73 | HR 70 | Temp 98.1°F | Resp 16 | Wt 118.4 lb

## 2020-03-29 DIAGNOSIS — Z3044 Encounter for surveillance of vaginal ring hormonal contraceptive device: Secondary | ICD-10-CM

## 2020-03-29 DIAGNOSIS — E039 Hypothyroidism, unspecified: Secondary | ICD-10-CM | POA: Diagnosis not present

## 2020-03-29 LAB — POCT URINE PREGNANCY: Preg Test, Ur: NEGATIVE

## 2020-03-29 MED ORDER — ETONOGESTREL-ETHINYL ESTRADIOL 0.12-0.015 MG/24HR VA RING
1.0000 | VAGINAL_RING | VAGINAL | 6 refills | Status: DC
Start: 2020-03-29 — End: 2020-07-12

## 2020-03-29 NOTE — Patient Instructions (Addendum)
Hypothyroidism  Hypothyroidism is when the thyroid gland does not make enough of certain hormones (it is underactive). The thyroid gland is a small gland located in the lower front part of the neck, just in front of the windpipe (trachea). This gland makes hormones that help control how the body uses food for energy (metabolism) as well as how the heart and brain function. These hormones also play a role in keeping your bones strong. When the thyroid is underactive, it produces too little of the hormones thyroxine (T4) and triiodothyronine (T3). What are the causes? This condition may be caused by:  Hashimoto's disease. This is a disease in which the body's disease-fighting system (immune system) attacks the thyroid gland. This is the most common cause.  Viral infections.  Pregnancy.  Certain medicines.  Birth defects.  Past radiation treatments to the head or neck for cancer.  Past treatment with radioactive iodine.  Past exposure to radiation in the environment.  Past surgical removal of part or all of the thyroid.  Problems with a gland in the center of the brain (pituitary gland).  Lack of enough iodine in the diet. What increases the risk? You are more likely to develop this condition if:  You are female.  You have a family history of thyroid conditions.  You use a medicine called lithium.  You take medicines that affect the immune system (immunosuppressants). What are the signs or symptoms? Symptoms of this condition include:  Feeling as though you have no energy (lethargy).  Not being able to tolerate cold.  Weight gain that is not explained by a change in diet or exercise habits.  Lack of appetite.  Dry skin.  Coarse hair.  Menstrual irregularity.  Slowing of thought processes.  Constipation.  Sadness or depression. How is this diagnosed? This condition may be diagnosed based on:  Your symptoms, your medical history, and a physical exam.  Blood  tests. You may also have imaging tests, such as an ultrasound or MRI. How is this treated? This condition is treated with medicine that replaces the thyroid hormones that your body does not make. After you begin treatment, it may take several weeks for symptoms to go away. Follow these instructions at home:  Take over-the-counter and prescription medicines only as told by your health care provider.  If you start taking any new medicines, tell your health care provider.  Keep all follow-up visits as told by your health care provider. This is important. ? As your condition improves, your dosage of thyroid hormone medicine may change. ? You will need to have blood tests regularly so that your health care provider can monitor your condition. Contact a health care provider if:  Your symptoms do not get better with treatment.  You are taking thyroid replacement medicine and you: ? Sweat a lot. ? Have tremors. ? Feel anxious. ? Lose weight rapidly. ? Cannot tolerate heat. ? Have emotional swings. ? Have diarrhea. ? Feel weak. Get help right away if you have:  Chest pain.  An irregular heartbeat.  A rapid heartbeat.  Difficulty breathing. Summary  Hypothyroidism is when the thyroid gland does not make enough of certain hormones (it is underactive).  When the thyroid is underactive, it produces too little of the hormones thyroxine (T4) and triiodothyronine (T3).  The most common cause is Hashimoto's disease, a disease in which the body's disease-fighting system (immune system) attacks the thyroid gland. The condition can also be caused by viral infections, medicine, pregnancy, or past   radiation treatment to the head or neck.  Symptoms may include weight gain, dry skin, constipation, feeling as though you do not have energy, and not being able to tolerate cold.  This condition is treated with medicine to replace the thyroid hormones that your body does not make. This information  is not intended to replace advice given to you by your health care provider. Make sure you discuss any questions you have with your health care provider. Document Revised: 06/05/2017 Document Reviewed: 06/03/2017 Elsevier Patient Education  2020 [employer]. Levothyroxine tablets What is this medicine? LEVOTHYROXINE (lee voe thye ROX een) is a thyroid hormone. This medicine can improve symptoms of thyroid deficiency such as slow speech, lack of energy, weight gain, hair loss, dry skin, and feeling cold. It also helps to treat goiter (an enlarged thyroid gland). It is also used to treat some kinds of thyroid cancer along with surgery and other medicines. This medicine may be used for other purposes; ask your health care provider or pharmacist if you have questions. COMMON BRAND NAME(S): Estre, Euthyrox, Levo-T, Levothroid, Levoxyl, Synthroid, Thyro-Tabs, Unithroid What should I tell my health care provider before I take this medicine? They need to know if you have any of these conditions:  Addison's disease or other adrenal gland problem  angina  bone problems  diabetes  dieting or on a weight loss program  fertility problems  heart disease  pituitary gland problem  take medicines that treat or prevent blood clots  an unusual or allergic reaction to levothyroxine, thyroid hormones, other medicines, foods, dyes, or preservatives  pregnant or trying to get pregnant  breast-feeding How should I use this medicine? Take this medicine by mouth with plenty of water. It is best to take on an empty stomach, at least 30 minutes to one hour before breakfast. Avoid taking antacids containing aluminum or magnesium, simethicone, bile acid sequestrants, calcium carbonate, sodium polystyrene sulfonate, ferrous sulfate, sevelamer, lanthanum, or sucralfate within 4 hours of taking this medicine. Follow the directions on the prescription label. Take at the same time each day. Do not take your  medicine more often than directed. Contact your pediatrician regarding the use of this medicine in children. While this drug may be prescribed for children and infants as young as a few days of age for selected conditions, precautions do apply. For infants, you may crush the tablet and place in a small amount of (5 to 10 mL or 1 to 2 teaspoonfuls) of water, breast milk, or non-soy based infant formula. Do not mix with soy-based infant formula. Give as directed. Overdosage: If you think you have taken too much of this medicine contact a poison control center or emergency room at once. NOTE: This medicine is only for you. Do not share this medicine with others. What if I miss a dose? If you miss a dose, take it as soon as you can. If it is almost time for your next dose, take only that dose. Do not take double or extra doses. What may interact with this medicine?  amiodarone  antacids  anti-thyroid medicines  calcium supplements  carbamazepine  certain medicines for depression  certain medicines to treat cancer  cholestyramine  clofibrate  colesevelam  colestipol  digoxin  female hormones, like estrogens and birth control pills, patches, rings, or injections  iron supplements  ketamine  lanthanum  liquid nutrition products like Ensure  lithium  medicines for colds and breathing difficulties  medicines for diabetes  medicines or dietary supplements  for weight loss  methadone  niacin  orlistat  oxandrolone  phenobarbital or other barbiturates  phenytoin  rifampin  sevelamer  simethicone  sodium polystyrene sulfonate  soy isoflavones  steroid medicines like prednisone or cortisone  sucralfate  testosterone  theophylline  warfarin This list may not describe all possible interactions. Give your health care provider a list of all the medicines, herbs, non-prescription drugs, or dietary supplements you use. Also tell them if you smoke, drink  alcohol, or use illegal drugs. Some items may interact with your medicine. What should I watch for while using this medicine? Be sure to take this medicine with plenty of fluids. Some tablets may cause choking, gagging, or difficulty swallowing from the tablet getting stuck in your throat. Most of these problems disappear if the medicine is taken with the right amount of water or other fluids. Do not switch brands of this medicine unless your health care professional agrees with the change. Ask questions if you are uncertain. You will need regular exams and occasional blood tests to check the response to treatment. If you are receiving this medicine for an underactive thyroid, it may be several weeks before you notice an improvement. Check with your doctor or health care professional if your symptoms do not improve. It may be necessary for you to take this medicine for the rest of your life. Do not stop using this medicine unless your doctor or health care professional advises you to. This medicine can affect blood sugar levels. If you have diabetes, check your blood sugar as directed. You may lose some of your hair when you first start treatment. With time, this usually corrects itself. If you are going to have surgery, tell your doctor or health care professional that you are taking this medicine. What side effects may I notice from receiving this medicine? Side effects that you should report to your doctor or health care professional as soon as possible:  allergic reactions like skin rash, itching or hives, swelling of the face, lips, or tongue  anxious  breathing problems  changes in menstrual periods  chest pain  diarrhea  excessive sweating or intolerance to heat  fast or irregular heartbeat  leg cramps  nervousness  swelling of ankles, feet, or legs  tremors  trouble sleeping  vomiting Side effects that usually do not require medical attention (report to your doctor or  health care professional if they continue or are bothersome):  changes in appetite  headache  irritable  nausea  weight loss This list may not describe all possible side effects. Call your doctor for medical advice about side effects. You may report side effects to FDA at 1-800-FDA-1088. Where should I keep my medicine? Keep out of the reach of children. Store at room temperature between 15 and 30 degrees C (59 and 86 degrees F). Protect from light and moisture. Keep container tightly closed. Throw away any unused medicine after the expiration date. NOTE: This sheet is a summary. It may not cover all possible information. If you have questions about this medicine, talk to your doctor, pharmacist, or health care provider.  2020 Elsevier/Gold Standard (2019-01-28 15:09:06) Ethinyl Estradiol; Etonogestrel vaginal ring What is this medicine? ETHINYL ESTRADIOL; ETONOGESTREL (ETH in il es tra DYE ole; et oh noe JES trel) vaginal ring is a flexible, vaginal ring used as a contraceptive (birth control method). This medicine combines 2 types of female hormones, an estrogen and a progestin. This ring is used to prevent ovulation and pregnancy.  Each ring is effective for 1 month. This medicine may be used for other purposes; ask your health care provider or pharmacist if you have questions. COMMON BRAND NAME(S): EluRyng, NuvaRing What should I tell my health care provider before I take this medicine? They need to know if you have any of these conditions:  abnormal vaginal bleeding  blood vessel disease or blood clots  breast, cervical, endometrial, ovarian, liver, or uterine cancer  diabetes  gallbladder disease  having surgery  heart disease or recent heart attack  high blood pressure  high cholesterol or triglycerides  history of irregular heartbeat or heart valve problems  kidney disease  liver disease  migraine headaches  protein C deficiency  protein S  deficiency  recently had a baby, miscarriage, or abortion  stroke  systemic lupus erythematosus (SLE)  tobacco smoker  your age is more than 31 years old  an unusual or allergic reaction to estrogens, progestins, other medicines, foods, dyes, or preservatives  pregnant or trying to get pregnant  breast-feeding How should I use this medicine? Insert the ring into your vagina as directed. Follow the directions on the prescription label. The ring will remain place for 3 weeks and is then removed for a 1-week break. A new ring is inserted 1 week after the last ring was removed, on the same day of the week. Check often to make sure the ring is still in place. If the ring was out of the vagina for an unknown amount of time, you may not be protected from pregnancy. Perform a pregnancy test and call your doctor. Do not use more often than directed. A patient package insert for the product will be given with each prescription and refill. Read this sheet carefully each time. The sheet may change frequently. Contact your pediatrician regarding the use of this medicine in children. Special care may be needed. Overdosage: If you think you have taken too much of this medicine contact a poison control center or emergency room at once. NOTE: This medicine is only for you. Do not share this medicine with others. What if I miss a dose? You will need to use the ring exactly as directed. It is very important to follow the schedule every cycle. If you do not use the ring as directed, you may not be protected from pregnancy. If the ring should slip out, is lost, or if you leave it in longer or shorter than you should, contact your health care professional for advice. What may interact with this medicine? Do not take this medicine with the following medications:  dasabuvir; ombitasvir; paritaprevir; ritonavir  ombitasvir; paritaprevir; ritonavir  vaginal lubricants or other vaginal products that are  oil-based or silicone-based This medicine may also interact with the following medications:  acetaminophen  antibiotics or medicines for infections, especially rifampin, rifabutin, rifapentine, and griseofulvin, and possibly penicillins or tetracyclines  aprepitant or fosaprepitant  armodafinil  ascorbic acid (vitamin C)  barbiturate medicines, such as phenobarbital or primidone  bosentan  certain antiviral medicines for hepatitis, HIV or AIDS  certain medicines for cancer treatment  certain medicines for seizures like carbamazepine, clobazam, felbamate, lamotrigine, oxcarbazepine, phenytoin, rufinamide, topiramate  certain medicines for treating high cholesterol  cyclosporine  dantrolene  elagolix  flibanserin  grapefruit juice  lesinurad  medicines for diabetes  medicines to treat fungal infections, such as griseofulvin, miconazole, fluconazole, ketoconazole, itraconazole, posaconazole or voriconazole  mifepristone  mitotane  modafinil  morphine  mycophenolate  St. John's wort  tamoxifen  temazepam  theophylline or aminophylline  thyroid hormones  tizanidine  tranexamic acid  ulipristal  warfarin This list may not describe all possible interactions. Give your health care provider a list of all the medicines, herbs, non-prescription drugs, or dietary supplements you use. Also tell them if you smoke, drink alcohol, or use illegal drugs. Some items may interact with your medicine. What should I watch for while using this medicine? Visit your doctor or health care professional for regular checks on your progress. You will need a regular breast and pelvic exam and Pap smear while on this medicine. Check with your doctor or health care professional to see if you need an additional method of contraception during the first cycle that you use this ring. Female condoms (made with natural rubber latex, polyisoprene, and polyurethane) and spermicides may be  used. Do not use a diaphragm, cervical cap, or a female condom, as the ring can interfere with these birth control methods and their proper placement. If you have any reason to think you are pregnant, stop using this medicine right away and contact your doctor or health care professional. If you are using this medicine for hormone related problems, it may take several cycles of use to see improvement in your condition. Smoking increases the risk of getting a blood clot or having a stroke while you are using hormonal birth control, especially if you are more than 31 years old. You are strongly advised not to smoke. Some women are prone to getting dark patches on the skin of the face (cholasma). Your risk of getting chloasma with this medicine is higher if you had chloasma during a pregnancy. Keep out of the sun. If you cannot avoid being in the sun, wear protective clothing and use sunscreen. Do not use sun lamps or tanning beds/booths. This medicine can make your body retain fluid, making your fingers, hands, or ankles swell. Your blood pressure can go up. Contact your doctor or health care professional if you feel you are retaining fluid. If you are going to have elective surgery, you may need to stop using this medicine before the surgery. Consult your health care professional for advice. This medicine does not protect you against HIV infection (AIDS) or any other sexually transmitted diseases. What side effects may I notice from receiving this medicine? Side effects that you should report to your doctor or health care professional as soon as possible:  allergic reactions such as skin rash or itching, hives, swelling of the lips, mouth, tongue, or throat  depression  high blood pressure  migraines or severe, sudden headaches  signs and symptoms of a blood clot such as breathing problems; changes in vision; chest pain; severe, sudden headache; pain, swelling, warmth in the leg; trouble speaking;  sudden numbness or weakness of the face, arm or leg  signs and symptoms of infection like fever or chills with dizziness and a sunburn-like rash, or pain or trouble passing urine  stomach pain  symptoms of vaginal infection like itching, irritation or unusual discharge  yellowing of the eyes or skin Side effects that usually do not require medical attention (report these to your doctor or health care professional if they continue or are bothersome):  acne  breast pain, tenderness  irregular vaginal bleeding or spotting, particularly during the first month of use  mild headache  nausea  painful periods  vomiting This list may not describe all possible side effects. Call your doctor for medical advice about side effects. You may  report side effects to FDA at 1-800-FDA-1088. Where should I keep my medicine? Keep out of the reach of children. Store unopened medicine for up to 4 months at room temperature at 15 and 30 degrees C (59 and 86 degrees F). Protect from light. Do not store above 30 degrees C (86 degrees F). Throw away any unused medicine 4 months after the dispense date or the expiration date, whichever comes first. A ring may only be used for 1 cycle (1 month). After the 3-week cycle, a used ring is removed and should be placed in the re-closable foil pouch and discarded in the trash out of reach of children and pets. Do NOT flush down the toilet. NOTE: This sheet is a summary. It may not cover all possible information. If you have questions about this medicine, talk to your doctor, pharmacist, or health care provider.  2020 Elsevier/Gold Standard (2019-01-13 12:31:47)  Hypothyroidism  Hypothyroidism is when the thyroid gland does not make enough of certain hormones (it is underactive). The thyroid gland is a small gland located in the lower front part of the neck, just in front of the windpipe (trachea). This gland makes hormones that help control how the body uses food for  energy (metabolism) as well as how the heart and brain function. These hormones also play a role in keeping your bones strong. When the thyroid is underactive, it produces too little of the hormones thyroxine (T4) and triiodothyronine (T3). What are the causes? This condition may be caused by:  Hashimoto's disease. This is a disease in which the body's disease-fighting system (immune system) attacks the thyroid gland. This is the most common cause.  Viral infections.  Pregnancy.  Certain medicines.  Birth defects.  Past radiation treatments to the head or neck for cancer.  Past treatment with radioactive iodine.  Past exposure to radiation in the environment.  Past surgical removal of part or all of the thyroid.  Problems with a gland in the center of the brain (pituitary gland).  Lack of enough iodine in the diet. What increases the risk? You are more likely to develop this condition if:  You are female.  You have a family history of thyroid conditions.  You use a medicine called lithium.  You take medicines that affect the immune system (immunosuppressants). What are the signs or symptoms? Symptoms of this condition include:  Feeling as though you have no energy (lethargy).  Not being able to tolerate cold.  Weight gain that is not explained by a change in diet or exercise habits.  Lack of appetite.  Dry skin.  Coarse hair.  Menstrual irregularity.  Slowing of thought processes.  Constipation.  Sadness or depression. How is this diagnosed? This condition may be diagnosed based on:  Your symptoms, your medical history, and a physical exam.  Blood tests. You may also have imaging tests, such as an ultrasound or MRI. How is this treated? This condition is treated with medicine that replaces the thyroid hormones that your body does not make. After you begin treatment, it may take several weeks for symptoms to go away. Follow these instructions at  home:  Take over-the-counter and prescription medicines only as told by your health care provider.  If you start taking any new medicines, tell your health care provider.  Keep all follow-up visits as told by your health care provider. This is important. ? As your condition improves, your dosage of thyroid hormone medicine may change. ? You will need to have  blood tests regularly so that your health care provider can monitor your condition. Contact a health care provider if:  Your symptoms do not get better with treatment.  You are taking thyroid replacement medicine and you: ? Sweat a lot. ? Have tremors. ? Feel anxious. ? Lose weight rapidly. ? Cannot tolerate heat. ? Have emotional swings. ? Have diarrhea. ? Feel weak. Get help right away if you have:  Chest pain.  An irregular heartbeat.  A rapid heartbeat.  Difficulty breathing. Summary  Hypothyroidism is when the thyroid gland does not make enough of certain hormones (it is underactive).  When the thyroid is underactive, it produces too little of the hormones thyroxine (T4) and triiodothyronine (T3).  The most common cause is Hashimoto's disease, a disease in which the body's disease-fighting system (immune system) attacks the thyroid gland. The condition can also be caused by viral infections, medicine, pregnancy, or past radiation treatment to the head or neck.  Symptoms may include weight gain, dry skin, constipation, feeling as though you do not have energy, and not being able to tolerate cold.  This condition is treated with medicine to replace the thyroid hormones that your body does not make. This information is not intended to replace advice given to you by your health care provider. Make sure you discuss any questions you have with your health care provider. Document Revised: 06/05/2017 Document Reviewed: 06/03/2017 Elsevier Patient Education  2020 [employer].

## 2020-03-29 NOTE — Progress Notes (Signed)
Established patient visit   Patient: Cassandra Rojas   DOB: 06-06-1991   29 y.o. Female  MRN: [id number] Visit Date: 03/29/2020  Today's healthcare provider: Jairo Ben, FNP   Chief Complaint  Patient presents with  . Hypothyroidism  . Contraception   Subjective    HPI  Hypothyroid, follow-up  Lab Results  Component Value Date   TSH 4.830 (H) 11/25/2019   Wt Readings from Last 3 Encounters:  03/29/20 118 lb 6.4 oz (53.7 kg)  11/10/19 120 lb 9.6 oz (54.7 kg)  04/12/17 117 lb (53.1 kg)    She was last seen for hypothyroid 4 months ago.  Management since that visit includes none. She reports excellent compliance with treatment. She is not having side effects.   Symptoms: No change in energy level No constipation  No diarrhea No heat / cold intolerance  No nervousness No palpitations  No weight changes     Patient  denies any fever, body aches,chills, rash, chest pain, shortness of breath, nausea, vomiting, or diarrhea.  Denies dizziness, lightheadedness, pre syncopal or syncopal episodes.   ----------------------------------------------------------------------------------------- Patient Active Problem List   Diagnosis Date Noted  . Hyperlipidemia 01/16/2020  . Elevated LDL cholesterol level 01/16/2020  . Hypothyroidism 11/29/2019  . Carpal tunnel syndrome of left wrist 11/11/2019  . Vitamin D deficiency 11/10/2019  . Allergy to cashew nut 11/10/2019  . History of hypothyroidism 11/10/2019  . Routine health maintenance 11/10/2019   Past Medical History:  Diagnosis Date  . Allergy   . Hypothyroid   . PCOS (polycystic ovarian syndrome)    Social History   Tobacco Use  . Smoking status: Never Smoker  . Smokeless tobacco: Never Used  Substance Use Topics  . Alcohol use: Yes    Alcohol/week: 4.0 standard drinks    Types: 1 Glasses of wine, 2 Cans of beer, 1 Shots of liquor per week  . Drug use: Never   Allergies  Allergen Reactions   . Other Cough    Mold* Cashews & pistachios patient reports swelling of throat  . Amoxapine And Related Other (See Comments)    Unsure of reaction, occurred as a child  . Amoxicillin     Patient reports childhood allergy       Medications: Outpatient Medications Prior to Visit  Medication Sig  . EPINEPHrine 0.3 mg/0.3 mL IJ SOAJ injection Inject 0.3 mLs (0.3 mg total) into the muscle as needed for anaphylaxis.  Marland Kitchen levothyroxine (SYNTHROID) 50 MCG tablet TAKE 1 TABLET BY MOUTH EVERY DAY  . [DISCONTINUED] etonogestrel-ethinyl estradiol (NUVARING) 0.12-0.015 MG/24HR vaginal ring Place 1 each vaginally every 28 (twenty-eight) days. Insert vaginally and leave in place for 3 consecutive weeks, then remove for 1 week.   No facility-administered medications prior to visit.    Review of Systems  Constitutional: Negative.   HENT: Negative.   Respiratory: Negative.   Cardiovascular: Negative.   Gastrointestinal: Negative.   Genitourinary: Negative.   Musculoskeletal: Negative.   Neurological: Negative.   Hematological: Negative.   Psychiatric/Behavioral: Negative.       Objective    BP 125/73   Pulse 70   Temp 98.1 F (36.7 C) (Oral)   Resp 16   Wt 118 lb 6.4 oz (53.7 kg)   LMP  (Within Weeks)   SpO2 98%   BMI 21.66 kg/m    Physical Exam Vitals and nursing note reviewed.  Constitutional:      General: She is not in acute distress.  Appearance: Normal appearance. She is well-developed. She is not ill-appearing, toxic-appearing or diaphoretic.     Interventions: She is not intubated. HENT:     Head: Normocephalic and atraumatic.     Right Ear: External ear normal.     Left Ear: External ear normal.     Nose: Nose normal. No congestion or rhinorrhea.     Mouth/Throat:     Pharynx: No oropharyngeal exudate or posterior oropharyngeal erythema.  Eyes:     General: Lids are normal. No scleral icterus.       Right eye: No discharge.        Left eye: No discharge.      Conjunctiva/sclera: Conjunctivae normal.     Right eye: Right conjunctiva is not injected. No exudate or hemorrhage.    Left eye: Left conjunctiva is not injected. No exudate or hemorrhage.    Pupils: Pupils are equal, round, and reactive to light.  Neck:     Thyroid: No thyroid mass or thyromegaly.     Vascular: Normal carotid pulses. No carotid bruit, hepatojugular reflux or JVD.     Trachea: Trachea and phonation normal. No tracheal tenderness or tracheal deviation.     Meningeal: Brudzinski's sign and Kernig's sign absent.  Cardiovascular:     Rate and Rhythm: Normal rate and regular rhythm.     Pulses: Normal pulses.          Radial pulses are 2+ on the right side and 2+ on the left side.       Dorsalis pedis pulses are 2+ on the right side and 2+ on the left side.       Posterior tibial pulses are 2+ on the right side and 2+ on the left side.     Heart sounds: Normal heart sounds, S1 normal and S2 normal. Heart sounds not distant. No murmur heard.  No friction rub. No gallop.   Pulmonary:     Effort: Pulmonary effort is normal. No tachypnea, bradypnea, accessory muscle usage or respiratory distress. She is not intubated.     Breath sounds: Normal breath sounds. No stridor. No wheezing or rales.  Chest:     Chest wall: No tenderness.  Abdominal:     General: Bowel sounds are normal. There is no distension or abdominal bruit.     Palpations: Abdomen is soft. There is no shifting dullness, fluid wave, hepatomegaly, splenomegaly or pulsatile mass.     Tenderness: There is no abdominal tenderness.  Musculoskeletal:        General: No tenderness or deformity. Normal range of motion.     Cervical back: Full passive range of motion without pain, normal range of motion and neck supple. No edema, erythema or rigidity. No spinous process tenderness or muscular tenderness. Normal range of motion.  Lymphadenopathy:     Head:     Right side of head: No submental, submandibular, tonsillar,  preauricular, posterior auricular or occipital adenopathy.     Left side of head: No submental, submandibular, tonsillar, preauricular, posterior auricular or occipital adenopathy.     Cervical: No cervical adenopathy.     Right cervical: No superficial, deep or posterior cervical adenopathy.    Left cervical: No superficial, deep or posterior cervical adenopathy.     Upper Body:     Right upper body: No supraclavicular or pectoral adenopathy.     Left upper body: No supraclavicular or pectoral adenopathy.  Skin:    General: Skin is warm and dry.  Capillary Refill: Capillary refill takes less than 2 seconds.     Coloration: Skin is not pale.     Findings: No abrasion, bruising, burn, ecchymosis, erythema, lesion, petechiae or rash.     Nails: There is no clubbing.  Neurological:     General: No focal deficit present.     Mental Status: She is alert and oriented to person, place, and time.     GCS: GCS eye subscore is 4. GCS verbal subscore is 5. GCS motor subscore is 6.     Cranial Nerves: No cranial nerve deficit.     Sensory: No sensory deficit.     Motor: No weakness, tremor, atrophy, abnormal muscle tone or seizure activity.     Coordination: Coordination normal.     Gait: Gait normal.     Deep Tendon Reflexes: Reflexes are normal and symmetric. Reflexes normal. Babinski sign absent on the right side. Babinski sign absent on the left side.     Reflex Scores:      Tricep reflexes are 2+ on the right side and 2+ on the left side.      Bicep reflexes are 2+ on the right side and 2+ on the left side.      Brachioradialis reflexes are 2+ on the right side and 2+ on the left side.      Patellar reflexes are 2+ on the right side and 2+ on the left side.      Achilles reflexes are 2+ on the right side and 2+ on the left side. Psychiatric:        Mood and Affect: Mood normal.        Speech: Speech normal.        Behavior: Behavior normal.        Thought Content: Thought content  normal.        Judgment: Judgment normal.      Results for orders placed or performed in visit on 03/29/20  POCT urine pregnancy  Result Value Ref Range   Preg Test, Ur Negative Negative    Assessment & Plan     Hypothyroidism, unspecified type - Plan: TSH, Comprehensive Metabolic Panel (CMET), POCT urine pregnancy  Encounter for surveillance of vaginal ring hormonal contraceptive device   Meds ordered this encounter  Medications  . etonogestrel-ethinyl estradiol (NUVARING) 0.12-0.015 MG/24HR vaginal ring    Sig: Place 1 each vaginally every 28 (twenty-eight) days. Insert vaginally and leave in place for 3 consecutive weeks, then remove for 1 week.    Dispense:  1 each    Refill:  6   Return in about 3 months (around 06/28/2020), or if symptoms worsen or fail to improve, for at any time for any worsening symptoms, Go to Emergency room/ urgent care if worse.       Red Flags discussed. The patient was given clear instructions to go to ER or return to medical center if any red flags develop, symptoms do not improve, worsen or new problems develop. They verbalized understanding.   Jairo Ben, FNP  [facility] [phone] (phone) [phone] (fax)  [facility]

## 2020-04-05 DIAGNOSIS — E039 Hypothyroidism, unspecified: Secondary | ICD-10-CM | POA: Diagnosis not present

## 2020-04-06 LAB — COMPREHENSIVE METABOLIC PANEL
ALT: 25 IU/L (ref 0–32)
AST: 20 IU/L (ref 0–40)
Albumin/Globulin Ratio: 2.1 (ref 1.2–2.2)
Albumin: 4.5 g/dL (ref 3.8–4.8)
Alkaline Phosphatase: 51 IU/L (ref 44–121)
BUN/Creatinine Ratio: 9 (ref 9–23)
BUN: 7 mg/dL (ref 6–20)
Bilirubin Total: 1 mg/dL (ref 0.0–1.2)
CO2: 22 mmol/L (ref 20–29)
Calcium: 9.5 mg/dL (ref 8.7–10.2)
Chloride: 103 mmol/L (ref 96–106)
Creatinine, Ser: 0.82 mg/dL (ref 0.57–1.00)
GFR calc Af Amer: 110 mL/min/{1.73_m2} (ref >59)
GFR calc non Af Amer: 96 mL/min/{1.73_m2} (ref >59)
Globulin, Total: 2.1 g/dL (ref 1.5–4.5)
Glucose: 92 mg/dL (ref 65–99)
Potassium: 4.3 mmol/L (ref 3.5–5.2)
Sodium: 139 mmol/L (ref 134–144)
Total Protein: 6.6 g/dL (ref 6.0–8.5)

## 2020-04-06 LAB — TSH: TSH: 2.52 u[IU]/mL (ref 0.450–4.500)

## 2020-04-09 ENCOUNTER — Other Ambulatory Visit: Payer: Self-pay | Admitting: Adult Health

## 2020-04-09 DIAGNOSIS — E039 Hypothyroidism, unspecified: Secondary | ICD-10-CM

## 2020-04-09 MED ORDER — LEVOTHYROXINE SODIUM 50 MCG PO TABS: 50.0000 ug | ORAL_TABLET | Freq: Every day | ORAL | 1 refills | Status: DC

## 2020-04-09 NOTE — Progress Notes (Signed)
TSh within range. CMP within normal limits. Continue current dosage of Synthroid. Recheck TSH lab around 3 months 07/11/2019. CMP within normal limits.

## 2020-04-09 NOTE — Progress Notes (Signed)
TSh within range. CMP within normal limits. Continue current dosage of Synthroid. Recheck TSH lab around 3 months 07/11/2019. CMP within normal limits.

## 2020-04-09 NOTE — Progress Notes (Signed)
Meds ordered this encounter  Medications  . levothyroxine (SYNTHROID) 50 MCG tablet    Sig: Take 1 tablet (50 mcg total) by mouth daily.    Dispense:  90 tablet    Refill:  1    Orders Placed This Encounter  Procedures  . TSH  labs around 07/10/2020.

## 2020-04-25 ENCOUNTER — Other Ambulatory Visit: Payer: Self-pay | Admitting: Adult Health

## 2020-05-24 ENCOUNTER — Other Ambulatory Visit: Payer: Self-pay | Admitting: Adult Health

## 2020-05-24 DIAGNOSIS — E039 Hypothyroidism, unspecified: Secondary | ICD-10-CM

## 2020-05-24 MED ORDER — LEVOTHYROXINE SODIUM 50 MCG PO TABS: 50.0000 ug | ORAL_TABLET | Freq: Every day | ORAL | 1 refills | Status: DC

## 2020-05-24 NOTE — Telephone Encounter (Signed)
Pt request transfer this refill   levothyroxine (SYNTHROID) 50 MCG tablet  Pt never picked this med up from [facility] because she did not have insurance. But now her new insurance requires her to use [facility] only. She would like to know  If we can take care of this for her?   [id number] [place], [place] - [address] AT [facility] OF [address] Phone:  [phone]  Fax:  [phone]

## 2020-07-12 ENCOUNTER — Other Ambulatory Visit: Payer: Self-pay

## 2020-07-12 ENCOUNTER — Ambulatory Visit (INDEPENDENT_AMBULATORY_CARE_PROVIDER_SITE_OTHER): Payer: BC Managed Care – PPO | Admitting: Adult Health

## 2020-07-12 ENCOUNTER — Encounter: Payer: Self-pay | Admitting: Adult Health

## 2020-07-12 VITALS — BP 99/69 | HR 55 | Temp 98.3°F | Resp 15 | Wt 129.4 lb

## 2020-07-12 DIAGNOSIS — Z3044 Encounter for surveillance of vaginal ring hormonal contraceptive device: Secondary | ICD-10-CM

## 2020-07-12 DIAGNOSIS — E039 Hypothyroidism, unspecified: Secondary | ICD-10-CM | POA: Diagnosis not present

## 2020-07-12 MED ORDER — ETONOGESTREL-ETHINYL ESTRADIOL 0.12-0.015 MG/24HR VA RING: 1.0000 | VAGINAL_RING | VAGINAL | 12 refills | Status: DC

## 2020-07-12 NOTE — Progress Notes (Signed)
Established patient visit   Patient: Cassandra Rojas   DOB: 1991-06-27   29 y.o. Female  MRN: [id number] Visit Date: 07/12/2020  Today's healthcare provider: Jairo Ben, FNP   Chief Complaint  Patient presents with  . Hypothyroidism   Subjective    HPI  Hypothyroid, follow-up  Lab Results  Component Value Date   TSH 2.520 04/05/2020   TSH 4.830 (H) 11/25/2019   Wt Readings from Last 3 Encounters:  07/12/20 129 lb 6.4 oz (58.7 kg)  03/29/20 118 lb 6.4 oz (53.7 kg)  11/10/19 120 lb 9.6 oz (54.7 kg)    She was last seen for hypothyroid 3 months ago.  Management since that visit includes none, continue current regimen. She reports excellent compliance with treatment. She is not having side effects.  No LMP recorded (within weeks). Taking Synthroid 50 mcg once daily.   Last pap 12/2018 Symptoms: No change in energy level No constipation  No diarrhea No heat / cold intolerance  No nervousness No palpitations  No weight changes   Pap results from care everywhere 12/2018 Category: nil  Final [facility] ([place]): [address], [employer]            Adequacy: endo  Final [facility] ([place]): [address], Investment banker, operational Provided ICD10: comment  Final [facility] forces logistics/support/administrative officer): [address], [place]       Performed by: comment  Final [facility] ([place]): [address], [place]       Note: comment  Final [facility] forces logistics/support/administrative officer): [address], [place]       Test Methodology: comment  Final [facility] ([place]): [address], [place]       HPV Aptima negative negative Final [facility] ([place]): [address], [employer] well.  Patient  denies any fever, body aches,chills, rash, chest pain, shortness of breath, nausea, vomiting, or diarrhea.  Denies dizziness, lightheadedness, pre syncopal or syncopal episodes.    -----------------------------------------------------------------------------------------  Patient Active Problem List   Diagnosis Date Noted  . Hyperlipidemia 01/16/2020  . Elevated LDL cholesterol level 01/16/2020  . Hypothyroidism 11/29/2019  . Carpal tunnel syndrome of left wrist 11/11/2019  . Vitamin D deficiency 11/10/2019  . Allergy to cashew nut 11/10/2019  . History of hypothyroidism 11/10/2019  . Routine health maintenance 11/10/2019   Past Medical History:  Diagnosis Date  . Allergy   . Hypothyroid   . PCOS (polycystic ovarian syndrome)    Allergies  Allergen Reactions  . Other Cough    Mold* Cashews & pistachios patient reports swelling of throat  . Amoxapine And Related Other (See Comments)    Unsure of reaction, occurred as a child  . Amoxicillin     Patient reports childhood allergy       Medications: Outpatient Medications Prior to Visit  Medication Sig  . EPINEPHrine 0.3 mg/0.3 mL IJ SOAJ injection Inject 0.3 mLs (0.3 mg total) into the muscle as needed for anaphylaxis.  Marland Kitchen levothyroxine (SYNTHROID) 50 MCG tablet Take 1 tablet (50 mcg total) by mouth daily.  . [DISCONTINUED] etonogestrel-ethinyl estradiol (NUVARING) 0.12-0.015 MG/24HR vaginal ring Place 1 each vaginally every 28 (twenty-eight) days. Insert vaginally and leave in place for 3 consecutive weeks, then remove for 1 week.   No facility-administered medications prior to visit.    Review of Systems  Constitutional: Negative.   HENT: Negative.   Respiratory: Negative.   Cardiovascular: Negative.   Gastrointestinal: Negative.   Endocrine: Negative.   Genitourinary:  Negative.   Musculoskeletal: Negative.   Neurological: Negative.   Hematological: Negative.   Psychiatric/Behavioral: Negative.     Last CBC Lab Results  Component Value Date   WBC 4.3 11/25/2019   HGB 14.0 11/25/2019   HCT 41.6 11/25/2019   MCV 93 11/25/2019   MCH 31.2 11/25/2019   RDW 11.8 11/25/2019   PLT 248  19/14/7829   Last metabolic panel Lab Results  Component Value Date   GLUCOSE 92 04/05/2020   NA 139 04/05/2020   K 4.3 04/05/2020   CL 103 04/05/2020   CO2 22 04/05/2020   BUN 7 04/05/2020   CREATININE 0.82 04/05/2020   GFRNONAA 96 04/05/2020   GFRAA 110 04/05/2020   CALCIUM 9.5 04/05/2020   PROT 6.6 04/05/2020   ALBUMIN 4.5 04/05/2020   LABGLOB 2.1 04/05/2020   AGRATIO 2.1 04/05/2020   BILITOT 1.0 04/05/2020   ALKPHOS 51 04/05/2020   AST 20 04/05/2020   ALT 25 04/05/2020   ANIONGAP 8 04/12/2017   Last lipids Lab Results  Component Value Date   CHOL 209 (H) 11/25/2019   HDL 88 11/25/2019   LDLCALC 107 (H) 11/25/2019   TRIG 79 11/25/2019   CHOLHDL 2.4 11/25/2019   Last hemoglobin A1c No results found for: HGBA1C Last thyroid functions Lab Results  Component Value Date   TSH 2.520 04/05/2020   Last vitamin D Lab Results  Component Value Date   VD25OH 70.7 11/25/2019   Last vitamin B12 and Folate No results found for: VITAMINB12, FOLATE  Objective    BP 99/69   Pulse (!) 55   Temp 98.3 F (36.8 C) (Oral)   Resp 15   Wt 129 lb 6.4 oz (58.7 kg)   LMP  (Within Weeks)   SpO2 100%   BMI 23.67 kg/m  BP Readings from Last 3 Encounters:  07/12/20 99/69  03/29/20 125/73  11/10/19 108/80   Wt Readings from Last 3 Encounters:  07/12/20 129 lb 6.4 oz (58.7 kg)  03/29/20 118 lb 6.4 oz (53.7 kg)  11/10/19 120 lb 9.6 oz (54.7 kg)      Physical Exam Vitals and nursing note reviewed.  Constitutional:      General: She is not in acute distress.    Appearance: Normal appearance. She is not ill-appearing, toxic-appearing or diaphoretic.     Comments: Patient appers well, not sickly. Speaking in complete sentences. Patient moves on and off of exam table and in room without difficulty. Gait is normal in hall and in room. Patient is oriented to person place time and situation. Patient answers questions appropriately and engages eye contact and verbal dialect with  provider.   HENT:     Head: Normocephalic and atraumatic.     Nose: Nose normal.     Mouth/Throat:     Mouth: Mucous membranes are moist.  Eyes:     Pupils: Pupils are equal, round, and reactive to light.  Cardiovascular:     Rate and Rhythm: Normal rate and regular rhythm.     Pulses: Normal pulses.     Heart sounds: Normal heart sounds. No murmur heard. No friction rub. No gallop.   Pulmonary:     Effort: Pulmonary effort is normal.     Breath sounds: Normal breath sounds.  Abdominal:     General: There is no distension.     Palpations: Abdomen is soft.     Tenderness: There is no abdominal tenderness.  Musculoskeletal:        General: Normal  range of motion.     Cervical back: Normal range of motion and neck supple. No tenderness.  Lymphadenopathy:     Cervical: No cervical adenopathy.  Skin:    General: Skin is warm.     Findings: No rash.  Neurological:     General: No focal deficit present.     Mental Status: She is alert and oriented to person, place, and time.     Gait: Gait normal.  Psychiatric:        Mood and Affect: Mood normal.        Behavior: Behavior normal.        Thought Content: Thought content normal.        Judgment: Judgment normal.     No results found for any visits on 07/12/20.  Assessment & Plan      Hypothyroidism, unspecified type - Plan: CBC with Differential/Platelet, TSH  Encounter for surveillance of vaginal ring hormonal contraceptive device - Plan: etonogestrel-ethinyl estradiol (NUVARING) 0.12-0.015 MG/24HR vaginal ring discussed side effects and possible side effect of the above medication.  When to seek care discussed.   Refill needed on bitth control. No LMP recorded (within weeks). Orders Placed This Encounter  Procedures  . CBC with Differential/Platelet  . TSH   Will refill synthroid after lab she still has current dosage at home now.  Red Flags discussed. The patient was given clear instructions to go to ER or return  to medical center if any red flags develop, symptoms do not improve, worsen or new problems develop. They verbalized understanding.  Needs CPE at next visit, bimanual only - pap is due 12/06/2021 unless clinically indicated sooner/  Return in about 6 months (around 01/09/2021), or if symptoms worsen or fail to improve, for at any time for any worsening symptoms, Go to Emergency room/ urgent care if worse.     The entirety of the information documented in the History of Present Illness, Review of Systems and Physical Exam were personally obtained by me. Portions of this information were initially documented by the CMA and reviewed by me for thoroughness and accuracy.     Jairo Ben, FNP  [facility] [phone] (phone) [phone] (fax)  [facility]

## 2020-07-12 NOTE — Patient Instructions (Signed)
Health Maintenance, Female Adopting a healthy lifestyle and getting preventive care are important in promoting health and wellness. Ask your health care provider about:  The right schedule for you to have regular tests and exams.  Things you can do on your own to prevent diseases and keep yourself healthy. What should I know about diet, weight, and exercise? Eat a healthy diet   Eat a diet that includes plenty of vegetables, fruits, low-fat dairy products, and lean protein.  Do not eat a lot of foods that are high in solid fats, added sugars, or sodium. Maintain a healthy weight Body mass index (BMI) is used to identify weight problems. It estimates body fat based on height and weight. Your health care provider can help determine your BMI and help you achieve or maintain a healthy weight. Get regular exercise Get regular exercise. This is one of the most important things you can do for your health. Most adults should:  Exercise for at least 150 minutes each week. The exercise should increase your heart rate and make you sweat (moderate-intensity exercise).  Do strengthening exercises at least twice a week. This is in addition to the moderate-intensity exercise.  Spend less time sitting. Even light physical activity can be beneficial. Watch cholesterol and blood lipids Have your blood tested for lipids and cholesterol at 32 years of age, then have this test every 5 years. Have your cholesterol levels checked more often if:  Your lipid or cholesterol levels are high.  You are older than 32 years of age.  You are at high risk for heart disease. What should I know about cancer screening? Depending on your health history and family history, you may need to have cancer screening at various ages. This may include screening for:  Breast cancer.  Cervical cancer.  Colorectal cancer.  Skin cancer.  Lung cancer. What should I know about heart disease, diabetes, and high blood  pressure? Blood pressure and heart disease  High blood pressure causes heart disease and increases the risk of stroke. This is more likely to develop in people who have high blood pressure readings, are of African descent, or are overweight.  Have your blood pressure checked: ? Every 3-5 years if you are 18-39 years of age. ? Every year if you are 40 years old or older. Diabetes Have regular diabetes screenings. This checks your fasting blood sugar level. Have the screening done:  Once every three years after age 40 if you are at a normal weight and have a low risk for diabetes.  More often and at a younger age if you are overweight or have a high risk for diabetes. What should I know about preventing infection? Hepatitis B If you have a higher risk for hepatitis B, you should be screened for this virus. Talk with your health care provider to find out if you are at risk for hepatitis B infection. Hepatitis C Testing is recommended for:  Everyone born from 1945 through 1965.  Anyone with known risk factors for hepatitis C. Sexually transmitted infections (STIs)  Get screened for STIs, including gonorrhea and chlamydia, if: ? You are sexually active and are younger than 32 years of age. ? You are older than 32 years of age and your health care provider tells you that you are at risk for this type of infection. ? Your sexual activity has changed since you were last screened, and you are at increased risk for chlamydia or gonorrhea. Ask your health care provider if   you are at risk.  Ask your health care provider about whether you are at high risk for HIV. Your health care provider may recommend a prescription medicine to help prevent HIV infection. If you choose to take medicine to prevent HIV, you should first get tested for HIV. You should then be tested every 3 months for as long as you are taking the medicine. Pregnancy  If you are about to stop having your period (premenopausal) and  you may become pregnant, seek counseling before you get pregnant.  Take 400 to 800 micrograms (mcg) of folic acid every day if you become pregnant.  Ask for birth control (contraception) if you want to prevent pregnancy. Osteoporosis and menopause Osteoporosis is a disease in which the bones lose minerals and strength with aging. This can result in bone fractures. If you are 76 years old or older, or if you are at risk for osteoporosis and fractures, ask your health care provider if you should:  Be screened for bone loss.  Take a calcium or vitamin D supplement to lower your risk of fractures.  Be given hormone replacement therapy (HRT) to treat symptoms of menopause. Follow these instructions at home: Lifestyle  Do not use any products that contain nicotine or tobacco, such as cigarettes, e-cigarettes, and chewing tobacco. If you need help quitting, ask your health care provider.  Do not use street drugs.  Do not share needles.  Ask your health care provider for help if you need support or information about quitting drugs. Alcohol use  Do not drink alcohol if: ? Your health care provider tells you not to drink. ? You are pregnant, may be pregnant, or are planning to become pregnant.  If you drink alcohol: ? Limit how much you use to 0-1 drink a day. ? Limit intake if you are breastfeeding.  Be aware of how much alcohol is in your drink. In the [place] one drink equals one 12 oz bottle of beer (355 mL), one 5 oz glass of wine (148 mL), or one 1 oz glass of hard liquor (44 mL). General instructions  Schedule regular health, dental, and eye exams.  Stay current with your vaccines.  Tell your health care provider if: ? You often feel depressed. ? You have ever been abused or do not feel safe at home. Summary  Adopting a healthy lifestyle and getting preventive care are important in promoting health and wellness.  Follow your health care provider's instructions about healthy  diet, exercising, and getting tested or screened for diseases.  Follow your health care provider's instructions on monitoring your cholesterol and blood pressure. This information is not intended to replace advice given to you by your health care provider. Make sure you discuss any questions you have with your health care provider. Document Revised: 06/16/2018 Document Reviewed: 06/16/2018 Elsevier Patient Education  2020 [employer]. Pap Test Why am I having this test? A Pap test, also called a Pap smear, is a screening test to check for signs of:  Cancer of the vagina, cervix, and uterus. The cervix is the lower part of the uterus that opens into the vagina.  Infection.  Changes that may be a sign that cancer is developing (precancerous changes). Women need this test on a regular basis. In general, you should have a Pap test every 3 years until you reach menopause or age 55. Women aged 30-60 may choose to have their Pap test done at the same time as an HPV (human papillomavirus) test  every 5 years (instead of every 3 years). Your health care provider may recommend having Pap tests more or less often depending on your medical conditions and past Pap test results. What kind of sample is taken?  Your health care provider will collect a sample of cells from the surface of your cervix. This will be done using a small cotton swab, plastic spatula, or brush. This sample is often collected during a pelvic exam, when you are lying on your back on an exam table with feet in footrests (stirrups). In some cases, fluids (secretions) from the cervix or vagina may also be collected. How do I prepare for this test?  Be aware of where you are in your menstrual cycle. If you are menstruating on the day of the test, you may be asked to reschedule.  You may need to reschedule if you have a known vaginal infection on the day of the test.  Follow instructions from your health care provider  about: ? Changing or stopping your regular medicines. Some medicines can cause abnormal test results, such as digitalis and tetracycline. ? Avoiding douching or taking a bath the day before or the day of the test. Tell a health care provider about:  Any allergies you have.  All medicines you are taking, including vitamins, herbs, eye drops, creams, and over-the-counter medicines.  Any blood disorders you have.  Any surgeries you have had.  Any medical conditions you have.  Whether you are pregnant or may be pregnant. How are the results reported? Your test results will be reported as either abnormal or normal. A false-positive result can occur. A false positive is incorrect because it means that a condition is present when it is not. A false-negative result can occur. A false negative is incorrect because it means that a condition is not present when it is. What do the results mean? A normal test result means that you do not have signs of cancer of the vagina, cervix, or uterus. An abnormal result may mean that you have:  Cancer. A Pap test by itself is not enough to diagnose cancer. You will have more tests done in this case.  Precancerous changes in your vagina, cervix, or uterus.  Inflammation of the cervix.  An STD (sexually transmitted disease).  A fungal infection.  A parasite infection. Talk with your health care provider about what your results mean. Questions to ask your health care provider Ask your health care provider, or the department that is doing the test:  When will my results be ready?  How will I get my results?  What are my treatment options?  What other tests do I need?  What are my next steps? Summary  In general, women should have a Pap test every 3 years until they reach menopause or age 56.  Your health care provider will collect a sample of cells from the surface of your cervix. This will be done using a small cotton swab, plastic spatula,  or brush.  In some cases, fluids (secretions) from the cervix or vagina may also be collected. This information is not intended to replace advice given to you by your health care provider. Make sure you discuss any questions you have with your health care provider. Document Revised: 03/02/2017 Document Reviewed: 03/02/2017 Elsevier Patient Education  2020 [employer]. Pregnancy and Hypothyroidism Hypothyroidism is a condition that develops if your thyroid has low activity. The thyroid is a small, butterfly-shaped gland in your neck. It is located in  front of your windpipe. It makes hormones that play an important role in regulating your breathing, heart rate, menstrual cycle, body temperature, and other bodily functions. If you have hypothyroidism, your thyroid gland does not produce enough thyroid hormones. When you are pregnant, your body uses more thyroid hormones. This can cause mild hypothyroidism to get worse. How does this affect me? Hypothyroidism during pregnancy can cause you to have:  Fatigue.  Abnormal weight gain. For women of normal weight, it is common to gain about 1 pound per week during pregnancy.  Difficulty having a bowel movement (constipation).  Feeling cold more often than others do.  Muscle aches.  Pregnancy complications, such as: ? High blood pressure that develops after the 20th week of pregnancy (preeclampsia). ? Pregnancy loss (miscarriage). ? Preterm birth. ? Placenta problems. How does this affect my baby? Hypothyroidism can also affect your baby. Babies need thyroid hormone from their mothers for normal growth and brain development. Babies born to mothers with hypothyroidism during pregnancy may:  Be born prematurely.  Have low birth weight.  Have mental delays.  May develop hypothyroidism. This is rare. What can I do to lower my risk? Some women with hypothyroidism need extra iodine during pregnancy. Your health care provider may recommend that  you:  Eat foods with iodine, such as: ? Iodized salt. ? Pasteurized eggs and dairy products. ? Low-mercury seafood.  Take a prenatal vitamin that contains iodine.  Take iodine supplements. How is this treated? Treatment may include:  Monitoring. If you have mild hypothyroidism, your health care provider will monitor your thyroid hormone levels closely to watch for any changes.  Medicines. Your health care provider may prescribe medicine to control your thyroid hormone levels. Follow these instructions at home:  Take over-the-counter and prescription medicines only as told by your health care provider. ? Check with your health care provider before taking any hypothyroid medicines that were prescribed before you became pregnant. Many are safe, but some treatments for hypothyroidism may have to be stopped during pregnancy.  You may be asked to perform kick counts to monitor your baby's movements. If your baby moves fewer than 10 times in 2 hours during a period when the baby is usually active (typically in the evening), you should see your health care provider right away.  Take a prenatal vitamin as told by your health care provider.  Keep all follow-up visits. This is important. Contact a health care provider if you:  Have new symptoms or your symptoms get worse.  Gain more than 5 lb (2.3 kg) in 1 week.  Have a lump in your neck.  Have a scratchy throat or difficulty speaking that lasts longer than a month and is not related to a cold.  Have a hard time swallowing. Get help right away if:  Your baby is less active than normal.  Your baby stops moving completely.  You develop muscle cramps.  You have pain in your abdomen.  You have heavy bleeding.  You develop a fever or chills.  You have a very bad headache or vision problems.  You develop swelling in your legs and ankles. Summary  Hypothyroidism is a condition that develops if your thyroid has low  activity.  Hypothyroidism during pregnancy can lead to complications for both you and your baby.  Take medicines, vitamins, and supplements as told by your health care provider during your pregnancy to control your condition.  Keep regular prenatal appointments so your health care provider can closely monitor  your condition during pregnancy. This information is not intended to replace advice given to you by your health care provider. Make sure you discuss any questions you have with your health care provider. Document Revised: 10/15/2018 Document Reviewed: 07/28/2017 Elsevier Patient Education  2020 [employer].

## 2020-07-13 LAB — CBC WITH DIFFERENTIAL/PLATELET
Basophils Absolute: 0 10*3/uL (ref 0.0–0.2)
Basos: 1 %
EOS (ABSOLUTE): 0.1 10*3/uL (ref 0.0–0.4)
Eos: 3 %
Hematocrit: 37 % (ref 34.0–46.6)
Hemoglobin: 12.5 g/dL (ref 11.1–15.9)
Immature Grans (Abs): 0 10*3/uL (ref 0.0–0.1)
Immature Granulocytes: 0 %
Lymphocytes Absolute: 1.9 10*3/uL (ref 0.7–3.1)
Lymphs: 47 %
MCH: 30.8 pg (ref 26.6–33.0)
MCHC: 33.8 g/dL (ref 31.5–35.7)
MCV: 91 fL (ref 79–97)
Monocytes Absolute: 0.4 10*3/uL (ref 0.1–0.9)
Monocytes: 10 %
Neutrophils Absolute: 1.6 10*3/uL (ref 1.4–7.0)
Neutrophils: 39 %
Platelets: 208 10*3/uL (ref 150–450)
RBC: 4.06 x10E6/uL (ref 3.77–5.28)
RDW: 11.5 % — ABNORMAL LOW (ref 11.7–15.4)
WBC: 4 10*3/uL (ref 3.4–10.8)

## 2020-07-13 LAB — TSH: TSH: 5.25 u[IU]/mL — ABNORMAL HIGH (ref 0.450–4.500)

## 2020-07-16 ENCOUNTER — Other Ambulatory Visit: Payer: Self-pay | Admitting: Adult Health

## 2020-07-16 DIAGNOSIS — E039 Hypothyroidism, unspecified: Secondary | ICD-10-CM

## 2020-07-16 MED ORDER — LEVOTHYROXINE SODIUM 75 MCG PO TABS: 75.0000 ug | ORAL_TABLET | Freq: Every day | ORAL | 1 refills | Status: DC

## 2020-07-16 NOTE — Progress Notes (Signed)
Meds ordered this encounter  Medications  . levothyroxine (SYNTHROID) 75 MCG tablet    Sig: Take 1 tablet (75 mcg total) by mouth daily.    Dispense:  90 tablet    Refill:  1    Medications Discontinued During This Encounter  Medication Reason  . levothyroxine (SYNTHROID) 50 MCG tablet Completed Course

## 2020-07-16 NOTE — Progress Notes (Signed)
TSH still elevated , need to increase synthroid to 75 mcg by mouth daily and discontinue current Synthroid 50 mcg dosage, recheck TSH in 3 months at lab. CBc ok , would not hurt to take a multitamin daily one that is food based.  Sent new script to pharmacy.  Meds ordered this encounter Medications  levothyroxine (SYNTHROID) 75 MCG tablet   Sig: Take 1 tablet (75 mcg total) by mouth daily.   Dispense:  90 tablet   Refill:  1 Medications Discontinued During This Encounter Medication Reason  levothyroxine (SYNTHROID) 50 MCG tablet Completed Course

## 2020-10-18 ENCOUNTER — Ambulatory Visit: Payer: BC Managed Care – PPO | Admitting: Adult Health

## 2020-10-18 ENCOUNTER — Other Ambulatory Visit: Payer: Self-pay

## 2020-10-18 ENCOUNTER — Encounter: Payer: Self-pay | Admitting: Adult Health

## 2020-10-18 VITALS — BP 132/83 | HR 95 | Ht 62.0 in | Wt 128.2 lb

## 2020-10-18 DIAGNOSIS — E039 Hypothyroidism, unspecified: Secondary | ICD-10-CM | POA: Diagnosis not present

## 2020-10-18 NOTE — Progress Notes (Signed)
Established patient visit   Patient: Cassandra Rojas   DOB: 21-Jun-1991   30 y.o. Female  MRN: [id number] Visit Date: 10/18/2020  Today's healthcare provider: Marcille Buffy, FNP   Chief Complaint  Patient presents with  . Hypothyroidism   Subjective    HPI  Hypothyroid, follow-up  Lab Results  Component Value Date   TSH 1.210 10/18/2020   TSH 5.250 (H) 07/12/2020   TSH 2.520 04/05/2020   Wt Readings from Last 3 Encounters:  10/18/20 128 lb 3.2 oz (58.2 kg)  07/12/20 129 lb 6.4 oz (58.7 kg)  03/29/20 118 lb 6.4 oz (53.7 kg)    She was last seen for hypothyroid 3 months ago.  Management since that visit includes increase synthroid to 75 mcg by mouth daily and discontinue current Synthroid 50 mcg dosage, recheck TSH in 3 months at lab. She reports excellent compliance with treatment. She is not having side effects.      10/17/20 was LMP denies any pregnancy.  Patient  denies any fever, body aches,chills, rash, chest pain, shortness of breath, nausea, vomiting, or diarrhea. ] Denies dizziness, lightheadedness, pre syncopal or syncopal episodes.       Medications: Outpatient Medications Prior to Visit  Medication Sig  . EPINEPHrine 0.3 mg/0.3 mL IJ SOAJ injection Inject 0.3 mLs (0.3 mg total) into the muscle as needed for anaphylaxis.  Marland Kitchen etonogestrel-ethinyl estradiol (NUVARING) 0.12-0.015 MG/24HR vaginal ring Place 1 each vaginally every 28 (twenty-eight) days. Insert vaginally and leave in place for 3 consecutive weeks, then remove for 1 week.  . levothyroxine (SYNTHROID) 75 MCG tablet Take 1 tablet (75 mcg total) by mouth daily.   No facility-administered medications prior to visit.    Review of Systems     Objective    BP 132/83 (BP Location: Right Arm, Patient Position: Sitting, Cuff Size: Normal)   Pulse 95   Ht 5' 2"  (1.575 m)   Wt 128 lb 3.2 oz (58.2 kg)   SpO2 100%   BMI 23.45 kg/m     Physical Exam Vitals reviewed.   Constitutional:      General: She is not in acute distress.    Appearance: She is well-developed. She is not diaphoretic.     Interventions: She is not intubated. HENT:     Head: Normocephalic and atraumatic.     Right Ear: External ear normal.     Left Ear: External ear normal.     Nose: Nose normal.     Mouth/Throat:     Pharynx: No oropharyngeal exudate.  Eyes:     General: Lids are normal. No scleral icterus.       Right eye: No discharge.        Left eye: No discharge.     Conjunctiva/sclera: Conjunctivae normal.     Right eye: Right conjunctiva is not injected. No exudate or hemorrhage.    Left eye: Left conjunctiva is not injected. No exudate or hemorrhage.    Pupils: Pupils are equal, round, and reactive to light.  Neck:     Thyroid: No thyroid mass or thyromegaly.     Vascular: Normal carotid pulses. No carotid bruit, hepatojugular reflux or JVD.     Trachea: Trachea and phonation normal. No tracheal tenderness or tracheal deviation.     Meningeal: Brudzinski's sign and Kernig's sign absent.  Cardiovascular:     Rate and Rhythm: Normal rate and regular rhythm.     Pulses: Normal pulses.  Radial pulses are 2+ on the right side and 2+ on the left side.       Dorsalis pedis pulses are 2+ on the right side and 2+ on the left side.       Posterior tibial pulses are 2+ on the right side and 2+ on the left side.     Heart sounds: Normal heart sounds, S1 normal and S2 normal. Heart sounds not distant. No murmur heard. No friction rub. No gallop.   Pulmonary:     Effort: Pulmonary effort is normal. No tachypnea, bradypnea, accessory muscle usage or respiratory distress. She is not intubated.     Breath sounds: Normal breath sounds. No stridor. No wheezing or rales.  Chest:     Chest wall: No tenderness.  Breasts:     Right: No supraclavicular adenopathy.     Left: No supraclavicular adenopathy.    Abdominal:     General: Bowel sounds are normal. There is no  distension or abdominal bruit.     Palpations: Abdomen is soft. There is no shifting dullness, fluid wave, hepatomegaly, splenomegaly, mass or pulsatile mass.     Tenderness: There is no abdominal tenderness. There is no guarding or rebound.     Hernia: No hernia is present.  Musculoskeletal:        General: No tenderness or deformity. Normal range of motion.     Cervical back: Full passive range of motion without pain, normal range of motion and neck supple. No edema, erythema or rigidity. No spinous process tenderness or muscular tenderness. Normal range of motion.  Lymphadenopathy:     Head:     Right side of head: No submental, submandibular, tonsillar, preauricular, posterior auricular or occipital adenopathy.     Left side of head: No submental, submandibular, tonsillar, preauricular, posterior auricular or occipital adenopathy.     Cervical: No cervical adenopathy.     Right cervical: No superficial, deep or posterior cervical adenopathy.    Left cervical: No superficial, deep or posterior cervical adenopathy.     Upper Body:     Right upper body: No supraclavicular or pectoral adenopathy.     Left upper body: No supraclavicular or pectoral adenopathy.  Skin:    General: Skin is warm and dry.     Coloration: Skin is not pale.     Findings: No abrasion, bruising, burn, ecchymosis, erythema, lesion, petechiae or rash.     Nails: There is no clubbing.  Neurological:     Mental Status: She is alert and oriented to person, place, and time.     GCS: GCS eye subscore is 4. GCS verbal subscore is 5. GCS motor subscore is 6.     Cranial Nerves: No cranial nerve deficit.     Sensory: No sensory deficit.     Motor: No tremor, atrophy, abnormal muscle tone or seizure activity.     Coordination: Coordination normal.     Gait: Gait normal.     Deep Tendon Reflexes: Reflexes are normal and symmetric. Reflexes normal. Babinski sign absent on the right side. Babinski sign absent on the left side.      Reflex Scores:      Tricep reflexes are 2+ on the right side and 2+ on the left side.      Bicep reflexes are 2+ on the right side and 2+ on the left side.      Brachioradialis reflexes are 2+ on the right side and 2+ on the left side.  Patellar reflexes are 2+ on the right side and 2+ on the left side.      Achilles reflexes are 2+ on the right side and 2+ on the left side. Psychiatric:        Speech: Speech normal.        Behavior: Behavior normal.        Thought Content: Thought content normal.        Judgment: Judgment normal.    Results for orders placed or performed in visit on 10/18/20  TSH  Result Value Ref Range   TSH 1.210 0.450 - 4.500 uIU/mL  Comprehensive Metabolic Panel (CMET)  Result Value Ref Range   Glucose 87 65 - 99 mg/dL   BUN 11 6 - 20 mg/dL   Creatinine, Ser 0.86 0.57 - 1.00 mg/dL   eGFR 92 >59 mL/min/1.73   BUN/Creatinine Ratio 13 9 - 23   Sodium 138 134 - 144 mmol/L   Potassium 4.1 3.5 - 5.2 mmol/L   Chloride 102 96 - 106 mmol/L   CO2 21 20 - 29 mmol/L   Calcium 9.3 8.7 - 10.2 mg/dL   Total Protein 6.4 6.0 - 8.5 g/dL   Albumin 4.5 3.8 - 4.8 g/dL   Globulin, Total 1.9 1.5 - 4.5 g/dL   Albumin/Globulin Ratio 2.4 (H) 1.2 - 2.2   Bilirubin Total 1.3 (H) 0.0 - 1.2 mg/dL   Alkaline Phosphatase 57 44 - 121 IU/L   AST 23 0 - 40 IU/L   ALT 29 0 - 32 IU/L  CBC with Differential/Platelet  Result Value Ref Range   WBC 5.5 3.4 - 10.8 x10E3/uL   RBC 4.39 3.77 - 5.28 x10E6/uL   Hemoglobin 13.2 11.1 - 15.9 g/dL   Hematocrit 40.0 34.0 - 46.6 %   MCV 91 79 - 97 fL   MCH 30.1 26.6 - 33.0 pg   MCHC 33.0 31.5 - 35.7 g/dL   RDW 11.7 11.7 - 15.4 %   Platelets 236 150 - 450 x10E3/uL   Neutrophils 56 Not Estab. %   Lymphs 32 Not Estab. %   Monocytes 10 Not Estab. %   Eos 1 Not Estab. %   Basos 1 Not Estab. %   Neutrophils Absolute 3.1 1.4 - 7.0 x10E3/uL   Lymphocytes Absolute 1.8 0.7 - 3.1 x10E3/uL   Monocytes Absolute 0.5 0.1 - 0.9 x10E3/uL   EOS  (ABSOLUTE) 0.0 0.0 - 0.4 x10E3/uL   Basophils Absolute 0.0 0.0 - 0.2 x10E3/uL   Immature Granulocytes 0 Not Estab. %   Immature Grans (Abs) 0.0 0.0 - 0.1 x10E3/uL    Assessment & Plan    Hypothyroidism, unspecified type - Plan: TSH, Comprehensive Metabolic Panel (CMET), CBC with Differential/Platelet   No orders of the defined types were placed in this encounter.  Orders Placed This Encounter  Procedures  . TSH  . Comprehensive Metabolic Panel (CMET)  . CBC with Differential/Platelet   Red Flags discussed. The patient was given clear instructions to go to ER or return to medical center if any red flags develop, symptoms do not improve, worsen or new problems develop. They verbalized understanding.   The entirety of the information documented in the History of Present Illness, Review of Systems and Physical Exam were personally obtained by me. Portions of this information were initially documented by the CMA and reviewed by me for thoroughness and accuracy.  Return in about 3 months (around 01/17/2021), or if symptoms worsen or fail to improve, for at any time for  any worsening symptoms, Go to Emergency room/ urgent care if worse.     The entirety of the information documented in the History of Present Illness, Review of Systems and Physical Exam were personally obtained by me. Portions of this information were initially documented by the CMA and reviewed by me for thoroughness and accuracy.  Marcille Buffy, [place] [phone] (phone) [phone] (fax)  [place]

## 2020-10-18 NOTE — Patient Instructions (Signed)
Levothyroxine tablets What is this medicine? LEVOTHYROXINE (lee voe thye ROX een) is a thyroid hormone. This medicine can improve symptoms of thyroid deficiency such as slow speech, lack of energy, weight gain, hair loss, dry skin, and feeling cold. It also helps to treat goiter (an enlarged thyroid gland). It is also used to treat some kinds of thyroid cancer along with surgery and other medicines. This medicine may be used for other purposes; ask your health care provider or pharmacist if you have questions. COMMON BRAND NAME(S): Estre, Euthyrox, Levo-T, Levothroid, Levoxyl, Synthroid, Thyro-Tabs, Unithroid What should I tell my health care provider before I take this medicine? They need to know if you have any of these conditions:  Addison's disease or other adrenal gland problem  angina  bone problems  diabetes  dieting or on a weight loss program  fertility problems  heart disease  pituitary gland problem  take medicines that treat or prevent blood clots  an unusual or allergic reaction to levothyroxine, thyroid hormones, other medicines, foods, dyes, or preservatives  pregnant or trying to get pregnant  breast-feeding How should I use this medicine? Take this medicine by mouth with plenty of water. It is best to take on an empty stomach, at least 30 minutes to one hour before breakfast. Avoid taking antacids containing aluminum or magnesium, simethicone, bile acid sequestrants, calcium carbonate, sodium polystyrene sulfonate, ferrous sulfate, sevelamer, lanthanum, or sucralfate within 4 hours of taking this medicine. Follow the directions on the prescription label. Take at the same time each day. Do not take your medicine more often than directed. Contact your pediatrician regarding the use of this medicine in children. While this drug may be prescribed for children and infants as young as a few days of age for selected conditions, precautions do apply. For infants, you may  crush the tablet and place in a small amount of (5 to 10 mL or 1 to 2 teaspoonfuls) of water, breast milk, or non-soy based infant formula. Do not mix with soy-based infant formula. Give as directed. Overdosage: If you think you have taken too much of this medicine contact a poison control center or emergency room at once. NOTE: This medicine is only for you. Do not share this medicine with others. What if I miss a dose? If you miss a dose, take it as soon as you can. If it is almost time for your next dose, take only that dose. Do not take double or extra doses. What may interact with this medicine?  amiodarone  antacids  anti-thyroid medicines  calcium supplements  carbamazepine  certain medicines for depression  certain medicines to treat cancer  cholestyramine  clofibrate  colesevelam  colestipol  digoxin  female hormones, like estrogens and birth control pills, patches, rings, or injections  iron supplements  ketamine  lanthanum  liquid nutrition products like Ensure  lithium  medicines for colds and breathing difficulties  medicines for diabetes  medicines or dietary supplements for weight loss  methadone  niacin  orlistat  oxandrolone  phenobarbital or other barbiturates  phenytoin  rifampin  sevelamer  simethicone  sodium polystyrene sulfonate  soy isoflavones  steroid medicines like prednisone or cortisone  sucralfate  testosterone  theophylline  warfarin This list may not describe all possible interactions. Give your health care provider a list of all the medicines, herbs, non-prescription drugs, or dietary supplements you use. Also tell them if you smoke, drink alcohol, or use illegal drugs. Some items may interact with your medicine.  What should I watch for while using this medicine? Be sure to take this medicine with plenty of fluids. Some tablets may cause choking, gagging, or difficulty swallowing from the tablet getting  stuck in your throat. Most of these problems disappear if the medicine is taken with the right amount of water or other fluids. Do not switch brands of this medicine unless your health care professional agrees with the change. Ask questions if you are uncertain. You will need regular exams and occasional blood tests to check the response to treatment. If you are receiving this medicine for an underactive thyroid, it may be several weeks before you notice an improvement. Check with your doctor or health care professional if your symptoms do not improve. It may be necessary for you to take this medicine for the rest of your life. Do not stop using this medicine unless your doctor or health care professional advises you to. This medicine can affect blood sugar levels. If you have diabetes, check your blood sugar as directed. You may lose some of your hair when you first start treatment. With time, this usually corrects itself. If you are going to have surgery, tell your doctor or health care professional that you are taking this medicine. What side effects may I notice from receiving this medicine? Side effects that you should report to your doctor or health care professional as soon as possible:  allergic reactions like skin rash, itching or hives, swelling of the face, lips, or tongue  anxious  breathing problems  changes in menstrual periods  chest pain  diarrhea  excessive sweating or intolerance to heat  fast or irregular heartbeat  leg cramps  nervousness  swelling of ankles, feet, or legs  tremors  trouble sleeping  vomiting Side effects that usually do not require medical attention (report to your doctor or health care professional if they continue or are bothersome):  changes in appetite  headache  irritable  nausea  weight loss This list may not describe all possible side effects. Call your doctor for medical advice about side effects. You may report side  effects to FDA at 1-800-FDA-1088. Where should I keep my medicine? Keep out of the reach of children. Store at room temperature between 15 and 30 degrees C (59 and 86 degrees F). Protect from light and moisture. Keep container tightly closed. Throw away any unused medicine after the expiration date. NOTE: This sheet is a summary. It may not cover all possible information. If you have questions about this medicine, talk to your doctor, pharmacist, or health care provider.  2021 Elsevier/Gold Standard (2019-06-16 13:39:26) Hypothyroidism  Hypothyroidism is when the thyroid gland does not make enough of certain hormones (it is underactive). The thyroid gland is a small gland located in the lower front part of the neck, just in front of the windpipe (trachea). This gland makes hormones that help control how the body uses food for energy (metabolism) as well as how the heart and brain function. These hormones also play a role in keeping your bones strong. When the thyroid is underactive, it produces too little of the hormones thyroxine (T4) and triiodothyronine (T3). What are the causes? This condition may be caused by:  Hashimoto's disease. This is a disease in which the body's disease-fighting system (immune system) attacks the thyroid gland. This is the most common cause.  Viral infections.  Pregnancy.  Certain medicines.  Birth defects.  Past radiation treatments to the head or neck for cancer.  Past  treatment with radioactive iodine.  Past exposure to radiation in the environment.  Past surgical removal of part or all of the thyroid.  Problems with a gland in the center of the brain (pituitary gland).  Lack of enough iodine in the diet. What increases the risk? You are more likely to develop this condition if:  You are female.  You have a family history of thyroid conditions.  You use a medicine called lithium.  You take medicines that affect the immune system  (immunosuppressants). What are the signs or symptoms? Symptoms of this condition include:  Feeling as though you have no energy (lethargy).  Not being able to tolerate cold.  Weight gain that is not explained by a change in diet or exercise habits.  Lack of appetite.  Dry skin.  Coarse hair.  Menstrual irregularity.  Slowing of thought processes.  Constipation.  Sadness or depression. How is this diagnosed? This condition may be diagnosed based on:  Your symptoms, your medical history, and a physical exam.  Blood tests. You may also have imaging tests, such as an ultrasound or MRI. How is this treated? This condition is treated with medicine that replaces the thyroid hormones that your body does not make. After you begin treatment, it may take several weeks for symptoms to go away. Follow these instructions at home:  Take over-the-counter and prescription medicines only as told by your health care provider.  If you start taking any new medicines, tell your health care provider.  Keep all follow-up visits as told by your health care provider. This is important. ? As your condition improves, your dosage of thyroid hormone medicine may change. ? You will need to have blood tests regularly so that your health care provider can monitor your condition. Contact a health care provider if:  Your symptoms do not get better with treatment.  You are taking thyroid hormone replacement medicine and you: ? Sweat a lot. ? Have tremors. ? Feel anxious. ? Lose weight rapidly. ? Cannot tolerate heat. ? Have emotional swings. ? Have diarrhea. ? Feel weak. Get help right away if you have:  Chest pain.  An irregular heartbeat.  A rapid heartbeat.  Difficulty breathing. Summary  Hypothyroidism is when the thyroid gland does not make enough of certain hormones (it is underactive).  When the thyroid is underactive, it produces too little of the hormones thyroxine (T4) and  triiodothyronine (T3).  The most common cause is Hashimoto's disease, a disease in which the body's disease-fighting system (immune system) attacks the thyroid gland. The condition can also be caused by viral infections, medicine, pregnancy, or past radiation treatment to the head or neck.  Symptoms may include weight gain, dry skin, constipation, feeling as though you do not have energy, and not being able to tolerate cold.  This condition is treated with medicine to replace the thyroid hormones that your body does not make. This information is not intended to replace advice given to you by your health care provider. Make sure you discuss any questions you have with your health care provider. Document Revised: 03/23/2020 Document Reviewed: 03/08/2020 Elsevier Patient Education  2021 [employer].

## 2020-10-19 LAB — CBC WITH DIFFERENTIAL/PLATELET
Basophils Absolute: 0 10*3/uL (ref 0.0–0.2)
Basos: 1 %
EOS (ABSOLUTE): 0 10*3/uL (ref 0.0–0.4)
Eos: 1 %
Hematocrit: 40 % (ref 34.0–46.6)
Hemoglobin: 13.2 g/dL (ref 11.1–15.9)
Immature Grans (Abs): 0 10*3/uL (ref 0.0–0.1)
Immature Granulocytes: 0 %
Lymphocytes Absolute: 1.8 10*3/uL (ref 0.7–3.1)
Lymphs: 32 %
MCH: 30.1 pg (ref 26.6–33.0)
MCHC: 33 g/dL (ref 31.5–35.7)
MCV: 91 fL (ref 79–97)
Monocytes Absolute: 0.5 10*3/uL (ref 0.1–0.9)
Monocytes: 10 %
Neutrophils Absolute: 3.1 10*3/uL (ref 1.4–7.0)
Neutrophils: 56 %
Platelets: 236 10*3/uL (ref 150–450)
RBC: 4.39 x10E6/uL (ref 3.77–5.28)
RDW: 11.7 % (ref 11.7–15.4)
WBC: 5.5 10*3/uL (ref 3.4–10.8)

## 2020-10-19 LAB — COMPREHENSIVE METABOLIC PANEL
ALT: 29 IU/L (ref 0–32)
AST: 23 IU/L (ref 0–40)
Albumin/Globulin Ratio: 2.4 — ABNORMAL HIGH (ref 1.2–2.2)
Albumin: 4.5 g/dL (ref 3.8–4.8)
Alkaline Phosphatase: 57 IU/L (ref 44–121)
BUN/Creatinine Ratio: 13 (ref 9–23)
BUN: 11 mg/dL (ref 6–20)
Bilirubin Total: 1.3 mg/dL — ABNORMAL HIGH (ref 0.0–1.2)
CO2: 21 mmol/L (ref 20–29)
Calcium: 9.3 mg/dL (ref 8.7–10.2)
Chloride: 102 mmol/L (ref 96–106)
Creatinine, Ser: 0.86 mg/dL (ref 0.57–1.00)
Globulin, Total: 1.9 g/dL (ref 1.5–4.5)
Glucose: 87 mg/dL (ref 65–99)
Potassium: 4.1 mmol/L (ref 3.5–5.2)
Sodium: 138 mmol/L (ref 134–144)
Total Protein: 6.4 g/dL (ref 6.0–8.5)
eGFR: 92 mL/min/{1.73_m2} (ref >59)

## 2020-10-19 LAB — TSH: TSH: 1.21 u[IU]/mL (ref 0.450–4.500)

## 2020-10-19 NOTE — Progress Notes (Signed)
Labs ok, recheck thyroid in 3 months. Continue current synthroid dosage.

## 2020-11-07 NOTE — Progress Notes (Signed)
Seeing Dr. Leonard Schwartz - for review.

## 2020-11-07 NOTE — Progress Notes (Signed)
Seeing Dr. B for follow up is scheduled for follow up with me at [facility] in July this needs to be changed to another  provider since I am no longer at that office. Lab scanned report for a provider to review.

## 2021-01-11 ENCOUNTER — Encounter: Payer: Self-pay | Admitting: Adult Health

## 2021-01-16 ENCOUNTER — Other Ambulatory Visit: Payer: Self-pay | Admitting: Adult Health

## 2021-01-16 MED ORDER — LEVOTHYROXINE SODIUM 75 MCG PO TABS
75.0000 ug | ORAL_TABLET | Freq: Every day | ORAL | 0 refills | Status: DC
Start: 2021-01-16 — End: 2021-02-15

## 2021-01-16 NOTE — Telephone Encounter (Signed)
Medication Refill - Medication: levothyroxine (SYNTHROID) 75 MCG tablet  Has the patient contacted their pharmacy? No   (Agent: If no, request that the patient contact the pharmacy for the refill.) Requesting a short supply to hold her over until 02/12/2021    Preferred Pharmacy (with phone number or street name):  [id number] [place], [place] - [address] AT [facility] [address] Phone:  [phone]  Fax:  [phone]      Agent: Please be advised that RX refills may take up to 3 business days. We ask that you follow-up with your pharmacy.

## 2021-02-12 ENCOUNTER — Encounter: Payer: Self-pay | Admitting: Family Medicine

## 2021-02-12 ENCOUNTER — Other Ambulatory Visit: Payer: Self-pay

## 2021-02-12 ENCOUNTER — Ambulatory Visit: Payer: BC Managed Care – PPO | Admitting: Family Medicine

## 2021-02-12 VITALS — BP 119/77 | HR 63 | Temp 98.4°F | Resp 16 | Wt 127.8 lb

## 2021-02-12 DIAGNOSIS — E782 Mixed hyperlipidemia: Secondary | ICD-10-CM | POA: Diagnosis not present

## 2021-02-12 DIAGNOSIS — Z3044 Encounter for surveillance of vaginal ring hormonal contraceptive device: Secondary | ICD-10-CM

## 2021-02-12 DIAGNOSIS — R17 Unspecified jaundice: Secondary | ICD-10-CM | POA: Diagnosis not present

## 2021-02-12 DIAGNOSIS — E039 Hypothyroidism, unspecified: Secondary | ICD-10-CM

## 2021-02-12 DIAGNOSIS — M62838 Other muscle spasm: Secondary | ICD-10-CM

## 2021-02-12 NOTE — Assessment & Plan Note (Signed)
Noted on last labs Will recheck and fractionate

## 2021-02-12 NOTE — Assessment & Plan Note (Signed)
Previously well controlled Continue Synthroid at current dose  Recheck TSH and adjust Synthroid as indicated   

## 2021-02-12 NOTE — Assessment & Plan Note (Signed)
Doing well  continue

## 2021-02-12 NOTE — Assessment & Plan Note (Signed)
Reviewed last lipid panel Not currently on a statin Recheck FLP and CMP Discussed diet and exercise  

## 2021-02-12 NOTE — Progress Notes (Signed)
Established patient visit   Patient: Cassandra Rojas   DOB: [birth year]   30 y.o. Female  MRN: [id number] Visit Date: 02/12/2021  Today's healthcare provider: Shirlee Latch, MD   Chief Complaint  Patient presents with   Hypothyroidism   Shoulder Pain   Subjective    Shoulder Pain  The pain is present in the left shoulder and left hand. This is a recurrent problem. The current episode started more than 1 month ago. There has been no history of extremity trauma. The problem occurs intermittently. The problem has been gradually worsening. The quality of the pain is described as aching. Associated symptoms include tingling. Pertinent negatives include no fever, inability to bear weight, itching, joint locking, joint swelling, limited range of motion, numbness or stiffness. She has tried NSAIDS for the symptoms. The treatment provided mild relief. Family history does not include gout. There is no history of diabetes, gout, osteoarthritis or rheumatoid arthritis. carpal tunnel   She does aerial silks for exercise and is concerned that this pain may be associated or making her symptoms worse. She denies consulting physical therapy.   Hypothyroid, follow-up  Lab Results  Component Value Date   TSH 1.210 10/18/2020   TSH 5.250 (H) 07/12/2020   TSH 2.520 04/05/2020   Wt Readings from Last 3 Encounters:  02/12/21 127 lb 12.8 oz (58 kg)  10/18/20 128 lb 3.2 oz (58.2 kg)  07/12/20 129 lb 6.4 oz (58.7 kg)    She was last seen for hypothyroid 4 months ago.  Management since that visit includes none. She reports excellent compliance with treatment. Tolerating 75 MCG synthroid. She is requesting for a refill.  She is not having side effects.   Symptoms: No change in energy level No constipation  No diarrhea No heat / cold intolerance  No nervousness No palpitations  No weight changes    Birth Control She is satisfied with the nuvaring. She denies vaginal  irritation/discomfort, spotting between periods, N/V, and bloating.  -----------------------------------------------------------------------------------------  Patient Active Problem List   Diagnosis Date Noted   Serum total bilirubin elevated 02/12/2021   Encounter for surveillance of vaginal ring hormonal contraceptive device 07/12/2020   Hyperlipidemia 01/16/2020   Hypothyroidism 11/29/2019   Carpal tunnel syndrome of left wrist 11/11/2019   Vitamin D deficiency 11/10/2019   Allergy to cashew nut 11/10/2019   Past Medical History:  Diagnosis Date   Allergy    Hypothyroid    PCOS (polycystic ovarian syndrome)                 Allergies  Allergen Reactions   Other Cough    Mold* Cashews & pistachios patient reports swelling of throat   Penicillins Anaphylaxis   Amoxicillin     Patient reports childhood allergy       Medications: Outpatient Medications Prior to Visit  Medication Sig   EPINEPHrine 0.3 mg/0.3 mL IJ SOAJ injection Inject 0.3 mLs (0.3 mg total) into the muscle as needed for anaphylaxis.   etonogestrel-ethinyl estradiol (NUVARING) 0.12-0.015 MG/24HR vaginal ring Place 1 each vaginally every 28 (twenty-eight) days. Insert vaginally and leave in place for 3 consecutive weeks, then remove for 1 week.   levothyroxine (SYNTHROID) 75 MCG tablet Take 1 tablet (75 mcg total) by mouth daily.   No facility-administered medications prior to visit.    Review of Systems  Constitutional:  Negative for chills, diaphoresis, fatigue and fever.  HENT:  Negative for ear pain, sinus pressure, sinus pain and sore  throat.   Eyes:  Negative for pain and visual disturbance.  Respiratory:  Negative for cough, chest tightness, shortness of breath and wheezing.   Cardiovascular:  Negative for chest pain, palpitations and leg swelling.  Gastrointestinal:  Negative for abdominal pain, blood in stool, diarrhea, nausea and vomiting.  Genitourinary:  Negative for dysuria, flank pain,  frequency, menstrual problem, pelvic pain, urgency and vaginal pain.  Musculoskeletal:  Negative for back pain, gout, myalgias, neck pain and stiffness.       (+)shoulder pain   Skin:  Negative for itching.  Neurological:  Positive for tingling. Negative for dizziness, seizures, syncope, weakness, light-headedness, numbness and headaches.   Last CBC Lab Results  Component Value Date   WBC 5.5 10/18/2020   HGB 13.2 10/18/2020   HCT 40.0 10/18/2020   MCV 91 10/18/2020   MCH 30.1 10/18/2020   RDW 11.7 10/18/2020   PLT 236 10/18/2020   Last metabolic panel Lab Results  Component Value Date   GLUCOSE 87 10/18/2020   NA 138 10/18/2020   K 4.1 10/18/2020   CL 102 10/18/2020   CO2 21 10/18/2020   BUN 11 10/18/2020   CREATININE 0.86 10/18/2020   GFRNONAA 96 04/05/2020   GFRAA 110 04/05/2020   CALCIUM 9.3 10/18/2020   PROT 6.4 10/18/2020   ALBUMIN 4.5 10/18/2020   LABGLOB 1.9 10/18/2020   AGRATIO 2.4 (H) 10/18/2020   BILITOT 1.3 (H) 10/18/2020   ALKPHOS 57 10/18/2020   AST 23 10/18/2020   ALT 29 10/18/2020   ANIONGAP 8 04/12/2017   Last lipids Lab Results  Component Value Date   CHOL 209 (H) 11/25/2019   HDL 88 11/25/2019   LDLCALC 107 (H) 11/25/2019   TRIG 79 11/25/2019   CHOLHDL 2.4 11/25/2019    Last thyroid functions Lab Results  Component Value Date   TSH 1.210 10/18/2020   Last vitamin D Lab Results  Component Value Date   VD25OH 70.7 11/25/2019        Objective    BP 119/77   Pulse 63   Temp 98.4 F (36.9 C) (Oral)   Resp 16   Wt 127 lb 12.8 oz (58 kg)   SpO2 100%   BMI 23.37 kg/m  BP Readings from Last 3 Encounters:  02/12/21 119/77  10/18/20 132/83  07/12/20 99/69   Wt Readings from Last 3 Encounters:  02/12/21 127 lb 12.8 oz (58 kg)  10/18/20 128 lb 3.2 oz (58.2 kg)  07/12/20 129 lb 6.4 oz (58.7 kg)       Physical Exam Vitals reviewed.  Constitutional:      General: She is not in acute distress.    Appearance: Normal appearance.  She is well-developed. She is not diaphoretic.  HENT:     Head: Normocephalic and atraumatic.  Eyes:     General: No scleral icterus.    Conjunctiva/sclera: Conjunctivae normal.  Neck:     Thyroid: No thyromegaly.  Cardiovascular:     Rate and Rhythm: Normal rate and regular rhythm.     Pulses: Normal pulses.     Heart sounds: Normal heart sounds. No murmur heard. Pulmonary:     Effort: Pulmonary effort is normal. No respiratory distress.     Breath sounds: Normal breath sounds. No wheezing, rhonchi or rales.  Musculoskeletal:     Right shoulder: No bony tenderness. Normal range of motion. Normal strength.     Left shoulder: No bony tenderness. Normal range of motion. Normal strength.     Cervical  back: Neck supple.     Right lower leg: No edema.     Left lower leg: No edema.     Comments: Tightness of left trapezius muscle, Left shoulder higher than right shoulder   Lymphadenopathy:     Cervical: No cervical adenopathy.  Skin:    General: Skin is warm and dry.     Findings: No rash.  Neurological:     Mental Status: She is alert and oriented to person, place, and time. Mental status is at baseline.  Psychiatric:        Mood and Affect: Mood normal.        Behavior: Behavior normal.     No results found for any visits on 02/12/21.  Assessment & Plan     Problem List Items Addressed This Visit       Endocrine   Hypothyroidism - Primary    Previously well controlled Continue Synthroid at current dose  Recheck TSH and adjust Synthroid as indicated         Relevant Orders   TSH     Other   Hyperlipidemia    Reviewed last lipid panel Not currently on a statin Recheck FLP and CMP Discussed diet and exercise        Relevant Orders   Hepatic function panel   Lipid panel   Encounter for surveillance of vaginal ring hormonal contraceptive device    Doing well  continue       Serum total bilirubin elevated    Noted on last labs Will recheck and  fractionate       Relevant Orders   Bilirubin, fractionated (tot/dir/indir)   Lipid panel   Other Visit Diagnoses     Trapezius muscle spasm       Relevant Orders   Ambulatory referral to Physical Therapy     - longstanding issue - shoulders are uneven on exam which is likely contributing - will send to PT for eval and treat   Return in about 6 months (around 08/15/2021) for CPE, With new PCP.      I,Essence Turner,acting as a Neurosurgeon for Shirlee Latch, MD.,have documented all relevant documentation on the behalf of Shirlee Latch, MD,as directed by  Shirlee Latch, MD while in the presence of Shirlee Latch, MD.   I, Shirlee Latch, MD, have reviewed all documentation for this visit. The documentation on 02/12/21 for the exam, diagnosis, procedures, and orders are all accurate and complete.   Liadan Guizar, Marzella Schlein, MD, MPH [facility]

## 2021-02-13 LAB — BILIRUBIN, FRACTIONATED(TOT/DIR/INDIR): Bilirubin, Indirect: 0.97 mg/dL — ABNORMAL HIGH (ref 0.10–0.80)

## 2021-02-13 LAB — LIPID PANEL
Chol/HDL Ratio: 2.5 ratio (ref 0.0–4.4)
Cholesterol, Total: 198 mg/dL (ref 100–199)
HDL: 79 mg/dL
LDL Chol Calc (NIH): 102 mg/dL — ABNORMAL HIGH (ref 0–99)
Triglycerides: 95 mg/dL (ref 0–149)
VLDL Cholesterol Cal: 17 mg/dL (ref 5–40)

## 2021-02-13 LAB — HEPATIC FUNCTION PANEL
ALT: 24 [IU]/L (ref 0–32)
AST: 22 [IU]/L (ref 0–40)
Albumin: 4.4 g/dL (ref 3.8–4.8)
Alkaline Phosphatase: 50 [IU]/L (ref 44–121)
Bilirubin Total: 1.2 mg/dL (ref 0.0–1.2)
Bilirubin, Direct: 0.23 mg/dL (ref 0.00–0.40)
Total Protein: 6.6 g/dL (ref 6.0–8.5)

## 2021-02-13 LAB — TSH: TSH: 1.92 u[IU]/mL (ref 0.450–4.500)

## 2021-02-15 ENCOUNTER — Other Ambulatory Visit: Payer: Self-pay | Admitting: Family Medicine

## 2021-02-15 MED ORDER — LEVOTHYROXINE SODIUM 75 MCG PO TABS: 75.0000 ug | ORAL_TABLET | Freq: Every day | ORAL | 0 refills | Status: DC

## 2021-02-15 NOTE — Telephone Encounter (Signed)
Medication Refill - Medication: levothyroxine (SYNTHROID) 75 MCG tablet   Has the patient contacted their pharmacy? Yes.   (Agent: If no, request that the patient contact the pharmacy for the refill.) (Agent: If yes, when and what did the pharmacy advise?)  Preferred Pharmacy (with phone number or street name):  [id number] [place], [place] - [address] AT [facility] [address] Phone:  [phone]  Fax:  [phone]      Agent: Please be advised that RX refills may take up to 3 business days. We ask that you follow-up with your pharmacy.

## 2021-03-05 ENCOUNTER — Other Ambulatory Visit: Payer: Self-pay

## 2021-03-05 ENCOUNTER — Ambulatory Visit: Payer: BC Managed Care – PPO | Attending: Family Medicine | Admitting: Physical Therapy

## 2021-03-05 DIAGNOSIS — M542 Cervicalgia: Secondary | ICD-10-CM | POA: Insufficient documentation

## 2021-03-05 NOTE — Therapy (Signed)
[facility] [address], [place], [address] Phone: [phone]   Fax:  [phone]  Physical Therapy Evaluation  Patient Details  Name: Cassandra Rojas MRN: [id number] Date of Birth: 1991/04/18 Referring Provider (PT): Shirlee Latch, MD   Encounter Date: 03/05/2021     Past Medical History:  Diagnosis Date   Allergy    Hypothyroid    PCOS (polycystic ovarian syndrome)     Past Surgical History:  Procedure Laterality Date   WISDOM TOOTH EXTRACTION      There were no vitals filed for this visit.    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 She has tried treating left sided neck pain with chiropractor and massage, but those did not help much. She then started using a standing desk and that helped for some time, but left sided neck pain has come back recently. She does ariel silks and yoga, but this does not make it feel worse.    Pertinent History 8/9 Dr. Beryle Flock     - longstanding issue  - shoulders are uneven on exam which is likely contributing  - will send to PT for eval and treat    How long can you sit comfortably? Cannot sit >30 min and sitting makes it worse. She does have a standing desk that she uses now    How long can you stand comfortably? Feels more comfortable standing than sitting    How long can you walk comfortably? N/a. Moving generally makes it feel better    Diagnostic tests None    Patient Stated Goals She wants a home exercise plan to self treat condition and ideally wants to resolve the pain.    Currently in Pain? No/denies   Tingling and tightness. N/T sometimes extends down left arm into hand.               [facility] PT Assessment - 03/05/21 0001       Assessment   Medical  Diagnosis Cervicalgia    Referring Provider (PT) Shirlee Latch, MD    Hand Dominance Right    Next MD Visit 08/2021    Prior Therapy No      Home Environment   Living Environment Private residence    Type of Home House    Home Access Stairs to enter    Entrance Stairs-Number of Steps 2    Home Layout One level      Prior Function   Level of Independence Independent      Cognition   Overall Cognitive Status Within Functional Limits for tasks assessed             CERIVCAL TRIAGE    5 D's And 3 N's: denies diplopia, drop attacks, dysarthria, dysphagia, ataxia of  gait, and nystagmus; denies lightheadedness, numbness and occasional nausea    MUSCULOSKELETAL:   ROM (gross): - UE ROM:                               Cervical.                     Normal  flex:   50            50                     ext:   60            60           R        L            Normal     SB:   45      45                      45  rot:   90      90                  70-90                                 R      L    Normal   - shoulder flx     180   180    180  - shoulder abd   180    180    180  - shoulder ext      60     60      60  - combined mvt      (touch pt)         - ER       Occiput  Occiput  Occiput         - IR            T7        T7            T7 ER @ 90 deg abd   110 110            90 IR @ 90 deg abd    70   70              70    STRENGTH   Strength R/L 5/5 Shoulder flexion (anterior deltoid/pec major/coracobrachialis, axillary n. (C5/6) and musculocutaneous n. (C5-7)) 5/5 Shoulder abduction (deltoid/supraspinatus, axillary/suprascapular n, C5) 5/5 Shoulder horizontal abduction 5/5 Elbow flexion (biceps brachii, brachialis, brachioradialis, musculoskeletal n, C5/6) 5/5 Elbow extension (triceps, radial n, C7) 5/5 Wrist Extension (C6/7) 5/5 Wrist Flexion (C6/7) 5/5 Finger adduction (interossei, ulnar n, T1) Cervical isometrics are strong in all  directions; 3+/3+ Upper Trap  3+/3+  Mid Trap   MYOTOMES                                R     L  Cervical Rotation (C1)        Shoulder shrug (C2-C4):   5/5    5/5               abd (C5):                 5/5    5/5        Elbow flex (C6):            5/5    5/5               ext (C7):  5/5    5/5        Wrist flex (C7):               5/5    5/5               ext (C6):                   5/5    5/5      Finger abd (C8/T1):           5/5    5/5     Grip Strength                    Good  Good   Length Discrepancy: Both legs even (17 inches) Hip Rotation using ASIS and PSIS: PSIS on right is posteriorly rotated and anteriorly rotated on left   SENSATION: All cervical dermatomes intact with no numbness and tingling noted                ------------------------------------------  Special Tests:                       Cervical Radiculopathy Test cluster:   -rotation AROM <60 deg towards symptomatic side?: Negative    -Spurling's test: Negative    -response to manual traction: NT   -upper limb tension test A: + on LUE relieved with left cervical side bending   Palpation/Accessory: Left rhomboid  ------------------------------------------- Imaging: None    THEREX:  Median Nerve Flossing 1 x 10  Seated Cervical Retraction 1 x 10  Doorway Rhomboid Stretch 2 x 60 sec       PT Education - 03/05/21 1008     Education Details form/technique for appropriate exercise    Person(s) Educated Patient    Methods Explanation;Demonstration    Comprehension Verbalized understanding;Returned demonstration;Verbal cues required              PT Short Term Goals - 03/05/21 1032       PT SHORT TERM GOAL #1   Title Patient will demonstrate understanding of home exercise plan.    Baseline 8/30: NT    Time 2    Period Weeks    Status New    Target Date 03/19/21               PT Long Term Goals - 03/05/21 1035       PT LONG TERM GOAL #1   Title Patient will  demonstrate symmetrical height of PSIS to indicate decreased hip rotation.    Baseline 8/30: Left anterior hip rotation, Right posterior hip rotation    Time 4    Period Weeks    Status New    Target Date 04/02/21      PT LONG TERM GOAL #2   Title Patient will have improved function and activity level as evidenced by an increase in FOTO score by 10 points or more.    Baseline 03/05/21: 74/79    Time 4    Period Weeks    Status New    Target Date 04/02/21      PT LONG TERM GOAL #3   Title Patient will exhibit a decrease in numbness and tingling in LUE after prolonged sitting (>2 min).    Baseline 8/30: Pain immediately when sitting down    Time 4    Period Weeks    Status New    Target Date 04/02/21  PT LONG TERM GOAL #4   Title Patient will decrease height of left shoulder elevation in order to be symmetrical with right shoulder.    Baseline 8/30: Left shoulder elevated compared to right shoulder (height not measured)    Time 4    Period Weeks    Status New    Target Date 04/02/21                    Plan - 03/05/21 1012     Clinical Impression Statement Pt is a 30 yo white female that presents for initial eval with left sided neck pain. She presents with increased pain and tenderness in her left rhomboid and elevated left shoulder possibly due to left anterior hip rotation and right posterior hip rotation. She also shows minor deficits with decreased periscapular strength. Unable to rule in cervical radiculopathy with negative cervical cluster. Pt will cotinue to benefit from skilled PT to increase periscapular strength and decrease cervical N/T and pain to better tolerate sitting positions for her job.    Personal Factors and Comorbidities Comorbidity 2    Comorbidities hypothyroidism, HLD    Examination-Activity Limitations Sit    Examination-Participation Restrictions Other   Working at desk for job   Stability/Clinical Decision Making Stable/Uncomplicated     Clinical Decision Making Low    Rehab Potential Excellent    PT Frequency 2x / week    PT Duration 4 weeks    PT Treatment/Interventions Therapeutic activities;Therapeutic exercise;Passive range of motion;Spinal Manipulations;Joint Manipulations;Manual techniques;Cryotherapy;Electrical Stimulation;Moist Heat    PT Next Visit Plan Progress periscapular strengthening exercises, muscle energy techniques for hip anterior rotation, soft tissue of periscapular            HEP includes:   Access Code: [email or web address] URL: [email or web address] Date: 03/05/2021 Prepared by: Ellin Goodie  Exercises Median Nerve Flossing - Tray - 1 x daily - 7 x weekly - 3 sets - 10 reps Seated Cervical Retraction - 1 x daily - 7 x weekly - 3 sets - 15 reps Doorway Rhomboid Stretch - 1 x daily - 7 x weekly - 1 sets - 5 reps - 30 hold   Patient will benefit from skilled therapeutic intervention in order to improve the following deficits and impairments:  Pain, Postural dysfunction, Impaired flexibility, Decreased strength  Visit Diagnosis: Cervicalgia     Problem List Patient Active Problem List   Diagnosis Date Noted   Serum total bilirubin elevated 02/12/2021   Encounter for surveillance of vaginal ring hormonal contraceptive device 07/12/2020   Hyperlipidemia 01/16/2020   Hypothyroidism 11/29/2019   Carpal tunnel syndrome of left wrist 11/11/2019   Vitamin D deficiency 11/10/2019   Allergy to cashew nut 11/10/2019   Ellin Goodie PT, DPT  03/05/2021, 10:56 AM  [facility] [address], [place], [address] Phone: [phone]   Fax:  [phone]  Name: Cassandra Rojas MRN: [id number] Date of Birth: 09-Aug-1990

## 2021-03-07 ENCOUNTER — Ambulatory Visit: Payer: BC Managed Care – PPO | Attending: Family Medicine | Admitting: Physical Therapy

## 2021-03-07 DIAGNOSIS — M542 Cervicalgia: Secondary | ICD-10-CM | POA: Insufficient documentation

## 2021-03-07 NOTE — Therapy (Signed)
[place] [facility]INE [address] [place], [address] Phone: [phone]   Fax:  [phone]  Physical Therapy Treatment  Patient Details  Name: Cassandra Rojas MRN: [id number] Date of Birth: [birth year] Referring Provider (PT): Shirlee Latch, MD   Encounter Date: 03/07/2021   PT End of Session - 03/07/21 0857     Visit Number 2    Number of Visits 9    Date for PT Re-Evaluation 04/09/21    PT Start Time 0800    PT Stop Time 0845    PT Time Calculation (min) 45 min    Activity Tolerance Patient tolerated treatment well    Behavior During Therapy [facility] for tasks assessed/performed             Past Medical History:  Diagnosis Date   Allergy    Hypothyroid    PCOS (polycystic ovarian syndrome)     Past Surgical History:  Procedure Laterality Date   WISDOM TOOTH EXTRACTION      There were no vitals filed for this visit.   Subjective Assessment - 03/07/21 0910     Subjective Pt reports slight relief in numbness and tingling after performing stretches. She is experiencing a tightness in her left shoulder blade.    Pertinent History 8/9 Dr. Beryle Flock     - longstanding issue  - shoulders are uneven on exam which is likely contributing  - will send to PT for eval and treat    How long can you sit comfortably? Cannot sit >30 min and sitting makes it worse. She does have a standing desk that she uses now    How long can you stand comfortably? Feels more comfortable standing than sitting    How long can you walk comfortably? N/a. Moving generally makes it feel better    Diagnostic tests None    Patient Stated Goals She wants a home exercise plan to self treat condition and ideally wants to resolve the pain.    Currently in Pain? No/denies             EVALUATION:  Right PSIS is dorsal and posterior compared to Left PSIS indicating pelvic rotation   MANUAL THERAPY:   Left rhomboid trigger point  release  Contract relax with hip flexion activation on right and hamstring on left   THEREX:  Shoulder Banded T's 2 x 10 with Red TB  Seated Scapular Retractions with Black TB 2 x 10  Education on use of theracane for trigger point management -Pt demonstrates understanding with relief of left periscapular trigger point.     Updated HEP and educated patient on changes to exercises and addition of new exercises        PT Education - 03/07/21 0856     Education Details form/technique for appropriate exercise    Person(s) Educated Patient    Methods Explanation;Demonstration;Verbal cues    Comprehension Verbalized understanding;Returned demonstration;Verbal cues required              PT Short Term Goals - 03/07/21 0908       PT SHORT TERM GOAL #1   Title Patient will demonstrate understanding of home exercise plan.    Baseline 8/30: NT    Time 2    Period Weeks    Status New    Target Date 03/19/21               PT Long Term Goals - 03/07/21 5993  PT LONG TERM GOAL #1   Title Patient will demonstrate symmetrical height of PSIS to indicate decreased hip rotation.    Baseline 8/30: Left anterior hip rotation, Right posterior hip rotation    Time 4    Period Weeks    Status New      PT LONG TERM GOAL #2   Title Patient will have improved function and activity level as evidenced by an increase in FOTO score by 10 points or more.    Baseline 03/05/21: 74/79    Time 4    Period Weeks    Status New      PT LONG TERM GOAL #3   Title Patient will exhibit a decrease in numbness and tingling in LUE after prolonged sitting (>2 min).    Baseline 8/30: Pain immediately when sitting down    Time 4    Period Weeks    Status New      PT LONG TERM GOAL #4   Title Patient will decrease height of left shoulder elevation in order to be symmetrical with right shoulder.    Baseline 8/30: Left shoulder elevated compared to right shoulder (height not measured)     Time 4    Period Weeks    Status New                   Plan - 03/07/21 0857     Clinical Impression Statement Pt presents for f/u for left sided neck pain. She demonstrates continued hyerextensibility in left periscapular musculature that resolves with trigger point release. She demonstrates ability to perform periscapular strengthening exercises without an increase in her pain or numbness and tinglging. 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    Personal Factors and Comorbidities Comorbidity 2    Comorbidities hypothyroidism, HLD    Examination-Activity Limitations Sit    Examination-Participation Restrictions Other   Working at desk for job   Stability/Clinical Decision Making Stable/Uncomplicated    Rehab Potential Excellent    PT Frequency 2x / week    PT Duration 4 weeks    PT Treatment/Interventions Therapeutic activities;Therapeutic exercise;Passive range of motion;Spinal Manipulations;Joint Manipulations;Manual techniques;Cryotherapy;Electrical Stimulation;Moist Heat    PT Next Visit Plan Progress periscapular strengthening exercises, Single Leg Bridge with resisted hip flexion to unrotate hip    PT Home Exercise Plan [email or web address]            HEP includes the following:  Access Code: [email or web address] URL: [email or web address] Date: 03/07/2021 Prepared by: Ellin Goodie  Exercises Seated Shoulder Row with Anchored Resistance - 1 x daily - 3 x weekly - 3 sets - 10 reps Standing Shoulder Horizontal Abduction with Resistance - 1 x daily - 7 x weekly - 3 sets - 10 reps Seated Cervical Retraction - 1 x daily - 3 x weekly - 3 sets - 15 reps Doorway Rhomboid Stretch - 1 x daily - 7 x weekly - 1 sets - 5 reps - 30 hold Median Nerve Flossing - Tray - 1 x daily - 7 x weekly - 3 sets - 10 reps  Patient will benefit from skilled therapeutic intervention in order to improve  the following deficits and impairments:  Pain, Postural dysfunction, Impaired flexibility, Decreased strength  Visit Diagnosis: Cervicalgia     Problem List Patient Active Problem List   Diagnosis Date Noted   Serum total bilirubin elevated 02/12/2021   Encounter for surveillance  of vaginal ring hormonal contraceptive device 07/12/2020   Hyperlipidemia 01/16/2020   Hypothyroidism 11/29/2019   Carpal tunnel syndrome of left wrist 11/11/2019   Vitamin D deficiency 11/10/2019   Allergy to cashew nut 11/10/2019   Ellin Goodie PT, DPT  03/07/2021, 9:17 AM  [place] [facility] PHYSICAL AND SPORTS MEDICINE [address], [place], [address] Phone: [phone]   Fax:  [phone]  Name: Cassandra Rojas MRN: [id number] Date of Birth: [birth year]

## 2021-03-13 ENCOUNTER — Ambulatory Visit: Payer: BC Managed Care – PPO | Admitting: Physical Therapy

## 2021-03-13 DIAGNOSIS — M542 Cervicalgia: Secondary | ICD-10-CM

## 2021-03-13 NOTE — Therapy (Signed)
[place] [facility]INE [address], [place], [address] Phone: [phone]   Fax:  [phone]  Physical Therapy Treatment  Patient Details  Name: Cassandra Rojas MRN: [id number] Date of Birth: 08/06/1990 Referring Provider (PT): Shirlee Latch, MD   Encounter Date: 03/13/2021   PT End of Session - 03/13/21 0841     Visit Number 3    Number of Visits 9    Date for PT Re-Evaluation 04/09/21    PT Start Time 0800    PT Stop Time 0835    PT Time Calculation (min) 35 min    Activity Tolerance Patient tolerated treatment well    Behavior During Therapy [facility] for tasks assessed/performed             Past Medical History:  Diagnosis Date   Allergy    Hypothyroid    PCOS (polycystic ovarian syndrome)     Past Surgical History:  Procedure Laterality Date   WISDOM TOOTH EXTRACTION      There were no vitals filed for this visit.   Subjective Assessment - 03/13/21 0802     Subjective Pt reports that she has had increased tightness because of increased time driving in her car. She describes this more as tightness.    Pertinent History 8/9 Dr. Beryle Flock     - longstanding issue  - shoulders are uneven on exam which is likely contributing  - will send to PT for eval and treat    How long can you sit comfortably? Cannot sit >30 min and sitting makes it worse. She does have a standing desk that she uses now    How long can you stand comfortably? Feels more comfortable standing than sitting    How long can you walk comfortably? N/a. Moving generally makes it feel better    Diagnostic tests None    Patient Stated Goals She wants a home exercise plan to self treat condition and ideally wants to resolve the pain.    Currently in Pain? No/denies            MANUAL:  Periscapular trigger point release  -Minimal trigger points noted   Upper Trap Massage   THEREX:   Upper Trap Stretch 2 x 30 sec   Doorway  Pec Stretch 2 x 30 sec   Muscle Energy Technique -Single Leg Bridge on Right with resisted hip flexion on left 2 x 5 sec holds   D2 PNF Pattern with Yellow TB 2 x 10  -min VC to maintain elbow extension    Updated HEP and educated patient on changes to exercises and addition of new exercises               PT Short Term Goals - 03/13/21 4081       PT SHORT TERM GOAL #1   Title Patient will demonstrate understanding of home exercise plan.    Baseline 8/30: NT    Time 2    Period Weeks    Status New    Target Date 03/19/21               PT Long Term Goals - 03/13/21 0837       PT LONG TERM GOAL #1   Title Patient will demonstrate symmetrical height of PSIS to indicate decreased hip rotation.    Baseline 8/30: Left anterior hip rotation, Right posterior hip rotation    Time 4    Period Weeks  Status New      PT LONG TERM GOAL #2   Title Patient will have improved function and activity level as evidenced by an increase in FOTO score by 10 points or more.    Baseline 03/05/21: 74/79    Time 4    Period Weeks    Status New      PT LONG TERM GOAL #3   Title Patient will exhibit a decrease in numbness and tingling in LUE after prolonged sitting (>2 min).    Baseline 8/30: Pain immediately when sitting down    Time 4    Period Weeks    Status New      PT LONG TERM GOAL #4   Title Patient will decrease height of left shoulder elevation in order to be symmetrical with right shoulder.    Baseline 8/30: Left shoulder elevated compared to right shoulder (height not measured)    Time 4    Period Weeks    Status New            HEP includes following:  Access Code: [email or web address] URL: [email or web address] Date: 03/13/2021 Prepared by: Ellin Goodie  Exercises Doorway Rhomboid Stretch - 1 x daily - 7 x weekly - 1 sets - 5 reps - 30 hold Median Nerve Flossing - Tray - 1 x daily - 7 x weekly - 3 sets - 10 reps Seated Shoulder Row with  Anchored Resistance - 1 x daily - 3 x weekly - 3 sets - 10 reps Standing Shoulder Horizontal Abduction with Resistance - 1 x daily - 3 x weekly - 3 sets - 10 reps Seated Cervical Retraction - 1 x daily - 3 x weekly - 3 sets - 15 reps Supine Scapular Protraction in Flexion with Dumbbells - 1 x daily - 3 x weekly - 2 sets - 10 reps Standing Shoulder Single Arm PNF D2 Flexion with Resistance - 1 x daily - 3 x weekly - 3 sets - 10 reps        Plan - 03/13/21 4270     Clinical Impression Statement Pt presents for f/u for left sided periscapular pain. She exhibits improved strength with ability to complete more advanced periscapular strengthening exercises and reduction in trigger points. 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    Personal Factors and Comorbidities Comorbidity 2    Comorbidities hypothyroidism, HLD    Examination-Activity Limitations Sit    Examination-Participation Restrictions Other   Working at desk for job   Stability/Clinical Decision Making Stable/Uncomplicated    Rehab Potential Excellent    PT Frequency 2x / week    PT Duration 4 weeks    PT Treatment/Interventions Therapeutic activities;Therapeutic exercise;Passive range of motion;Spinal Manipulations;Joint Manipulations;Manual techniques;Cryotherapy;Electrical Stimulation;Moist Heat    PT Next Visit Plan Reassess muscle PSIS. Progress periscapular strengthening exercises: Bent T's and Y's    PT Home Exercise Plan [phone]             Patient will benefit from skilled therapeutic intervention in order to improve the following deficits and impairments:  Pain, Postural dysfunction, Impaired flexibility, Decreased strength  Visit Diagnosis: Cervicalgia     Problem List Patient Active Problem List   Diagnosis Date Noted   Serum total bilirubin elevated 02/12/2021   Encounter for surveillance of vaginal ring  hormonal contraceptive device 07/12/2020   Hyperlipidemia 01/16/2020   Hypothyroidism 11/29/2019   Carpal tunnel syndrome  of left wrist 11/11/2019   Vitamin D deficiency 11/10/2019   Allergy to cashew nut 11/10/2019   Ellin Goodie PT, DPT  03/13/2021, 8:45 AM  [place] [facility] PHYSICAL AND SPORTS MEDICINE [address] [place], [address] Phone: [phone]   Fax:  [phone]  Name: Cassandra Rojas MRN: [id number] Date of Birth: Nov 16, 1990

## 2021-03-14 ENCOUNTER — Encounter: Payer: Self-pay | Admitting: Family Medicine

## 2021-03-14 ENCOUNTER — Other Ambulatory Visit: Payer: Self-pay | Admitting: Family Medicine

## 2021-03-14 NOTE — Telephone Encounter (Signed)
Requested Prescriptions  Pending Prescriptions Disp Refills  . levothyroxine (SYNTHROID) 75 MCG tablet [Pharmacy Med Name: LEVOTHYROXINE 0.075MG  ( ) TABS] 90 tablet 3    Sig: TAKE 1 TABLET(75 MCG) BY MOUTH DAILY     Endocrinology:  Hypothyroid Agents Failed - 03/14/2021  3:45 AM      Failed - TSH needs to be rechecked within 3 months after an abnormal result. Refill until TSH is due.      Passed - TSH in normal range and within 360 days    TSH  Date Value Ref Range Status  02/12/2021 1.920 0.450 - 4.500 uIU/mL Final         Passed - Valid encounter within last 12 months    Recent Outpatient Visits          1 month ago Hypothyroidism, unspecified type   [facility], Marzella Schlein, MD   4 months ago Hypothyroidism, unspecified type   [facility], Eula Fried, FNP   8 months ago Hypothyroidism, unspecified type   [facility], Eula Fried, FNP   11 months ago Hypothyroidism, unspecified type   [employer], Eula Fried, FNP   1 year ago Routine health maintenance   [facility], Eula Fried, FNP      Future Appointments            In 5 months Jacky Kindle, FNP [facility]

## 2021-03-15 ENCOUNTER — Other Ambulatory Visit: Payer: Self-pay

## 2021-03-15 ENCOUNTER — Ambulatory Visit: Payer: BC Managed Care – PPO | Admitting: Physical Therapy

## 2021-03-15 DIAGNOSIS — M542 Cervicalgia: Secondary | ICD-10-CM | POA: Diagnosis not present

## 2021-03-15 NOTE — Therapy (Signed)
[place] [facility] [address] [place], [address] Phone: [phone]   Fax:  [phone]  Physical Therapy Treatment  Patient Details  Name: Cassandra Rojas MRN: [id number] Date of Birth: 1991-04-06 Referring Provider (PT): Shirlee Latch, MD   Encounter Date: 03/15/2021   PT End of Session - 03/15/21 0843     Visit Number 4    Number of Visits 9    Date for PT Re-Evaluation 04/09/21    PT Start Time 0800    PT Stop Time 0845    PT Time Calculation (min) 45 min    Activity Tolerance Patient tolerated treatment well    Behavior During Therapy [facility] for tasks assessed/performed             Past Medical History:  Diagnosis Date   Allergy    Hypothyroid    PCOS (polycystic ovarian syndrome)     Past Surgical History:  Procedure Laterality Date   WISDOM TOOTH EXTRACTION      There were no vitals filed for this visit.   Subjective Assessment - 03/15/21 0805     Subjective Pt states that she feels increased tightness in her left shoulder blade. Her exercises have been going well.    Pertinent History 8/9 Dr. Beryle Flock     - longstanding issue  - shoulders are uneven on exam which is likely contributing  - will send to PT for eval and treat    How long can you sit comfortably? Cannot sit >30 min and sitting makes it worse. She does have a standing desk that she uses now    How long can you stand comfortably? Feels more comfortable standing than sitting    How long can you walk comfortably? N/a. Moving generally makes it feel better    Diagnostic tests None    Patient Stated Goals She wants a home exercise plan to self treat condition and ideally wants to resolve the pain.    Currently in Pain? Yes    Pain Score 3     Pain Location Other (Comment)    Pain Orientation Left    Pain Descriptors / Indicators Aching    Pain Type Acute pain            EVAL:  PSIS alignment: Anterior rotation  of left PSIS and posterior rotation of right PSIS    MANUAL:  Trigger point release of left periscapular   THEREX:  Levator Scap Stretch 2 x 30 sec  Face Pulls with YTB 1 x 10  Lat Pull Downs with YTB 1 x 10  Lat Pull Downs with Blue TB 1 x 10  -Min VC to maintain elbow extension throughout    Updated HEP and educated patient on changes to exercises and addition of new exercises. Removal of median nerve glides and addition of face pulls, levator scap stretch, and lat pull downs.           PT Education - 03/15/21 0842     Education Details form/technique for appropriate exercise    Person(s) Educated Patient    Methods Explanation;Demonstration;Verbal cues;Handout    Comprehension Verbalized understanding;Returned demonstration;Verbal cues required              PT Short Term Goals - 03/15/21 0846       PT SHORT TERM GOAL #1   Title Patient will demonstrate understanding of home exercise plan.    Baseline 8/30: NT    Time 2  Period Weeks    Status New    Target Date 03/19/21               PT Long Term Goals - 03/15/21 0847       PT LONG TERM GOAL #1   Title Patient will demonstrate symmetrical height of PSIS to indicate decreased hip rotation.    Baseline 8/30: Left anterior hip rotation, Right posterior hip rotation    Time 4    Period Weeks    Status New      PT LONG TERM GOAL #2   Title Patient will have improved function and activity level as evidenced by an increase in FOTO score by 10 points or more.    Baseline 03/05/21: 74/79    Time 4    Period Weeks    Status New      PT LONG TERM GOAL #3   Title Patient will exhibit a decrease in numbness and tingling in LUE after prolonged sitting (>2 min).    Baseline 8/30: Pain immediately when sitting down    Time 4    Period Weeks    Status New      PT LONG TERM GOAL #4   Title Patient will decrease height of left shoulder elevation in order to be symmetrical with right shoulder.     Baseline 8/30: Left shoulder elevated compared to right shoulder (height not measured)    Time 4    Period Weeks    Status New                   Plan - 03/15/21 0865     Clinical Impression Statement Pt presents for f/u for left sided periscapular pain. She demonstrates an increased in periscapular strength with ability to complete face pulls and lat pull downs without an increase in her pain. She still demonstrates an left sided anterior rotation of her pelvis that has not been correct with muscle energy technique. Pt has shown a significant amount of progress and consistent adherence to HEP to warrant reducing apt frequency to one time per week. 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    Personal Factors and Comorbidities Comorbidity 2    Comorbidities hypothyroidism, HLD    Examination-Activity Limitations Sit    Examination-Participation Restrictions Other   Working at desk for job   Stability/Clinical Decision Making Stable/Uncomplicated    Rehab Potential Excellent    PT Frequency 2x / week    PT Duration 4 weeks    PT Treatment/Interventions Therapeutic activities;Therapeutic exercise;Passive range of motion;Spinal Manipulations;Joint Manipulations;Manual techniques;Cryotherapy;Electrical Stimulation;Moist Heat    PT Next Visit Plan Reassess muscle PSIS. Progress periscapular strengthening exercises: Bent T's and Y's, Shoulder Stars, Protraction with foam roller.    PT Home Exercise Plan [email or web address]    Consulted and Agree with Plan of Care Patient            HEP includes following:  Access Code: [email or web address] URL: [email or web address] Date: 03/15/2021 Prepared by: Ellin Goodie  Exercises Doorway Rhomboid Stretch - 1 x daily - 7 x weekly - 1 sets - 5 reps - 30 hold Seated Shoulder Row with Anchored Resistance - 1 x daily - 3 x weekly - 3 sets - 10  reps Standing Shoulder Horizontal Abduction with Resistance - 1 x daily - 3 x weekly - 3 sets - 10 reps Seated Cervical Retraction - 1  x daily - 3 x weekly - 3 sets - 15 reps Supine Scapular Protraction in Flexion with Dumbbells - 1 x daily - 3 x weekly - 2 sets - 10 reps Standing Shoulder Single Arm PNF D2 Flexion with Resistance - 1 x daily - 3 x weekly - 3 sets - 10 reps Seated Levator Scapulae Stretch - 1 x daily - 7 x weekly - 3 sets - 10 reps Standing Lat Pull Down with Resistance-Elbows Extension - 1 x daily - 3 x weekly - 3 sets - 10 reps Face Pulls - 1 x daily - 3 x weekly - 3 sets - 10 reps   Patient will benefit from skilled therapeutic intervention in order to improve the following deficits and impairments:  Pain, Postural dysfunction, Impaired flexibility, Decreased strength  Visit Diagnosis: Cervicalgia     Problem List Patient Active Problem List   Diagnosis Date Noted   Serum total bilirubin elevated 02/12/2021   Encounter for surveillance of vaginal ring hormonal contraceptive device 07/12/2020   Hyperlipidemia 01/16/2020   Hypothyroidism 11/29/2019   Carpal tunnel syndrome of left wrist 11/11/2019   Vitamin D deficiency 11/10/2019   Allergy to cashew nut 11/10/2019   Ellin Goodie PT, DPT  03/15/2021, 8:47 AM  [place] [facility] PH[facility]TS MEDICINE [address] [place], [address] Phone: [phone]   Fax:  [phone]  Name: CHLOEANN ALFRED MRN: [id number] Date of Birth: [birth year]

## 2021-03-19 ENCOUNTER — Ambulatory Visit: Payer: BC Managed Care – PPO | Admitting: Physical Therapy

## 2021-03-19 DIAGNOSIS — M542 Cervicalgia: Secondary | ICD-10-CM | POA: Diagnosis not present

## 2021-03-19 NOTE — Therapy (Signed)
[place] [facility] [address] [place], [address] Phone: [phone]   Fax:  [phone]  Physical Therapy Treatment  Patient Details  Name: Cassandra Rojas MRN: [id number] Date of Birth: 08-02-1990 Referring Provider (PT): Lavon Paganini, MD   Encounter Date: 03/19/2021   PT End of Session - 03/19/21 0834     Visit Number 5    Number of Visits 9    Date for PT Re-Evaluation 04/09/21    PT Start Time 0800    PT Stop Time 0830    PT Time Calculation (min) 30 min    Activity Tolerance Patient tolerated treatment well    Behavior During Therapy [facility] for tasks assessed/performed             Past Medical History:  Diagnosis Date   Allergy    Hypothyroid    PCOS (polycystic ovarian syndrome)     Past Surgical History:  Procedure Laterality Date   WISDOM TOOTH EXTRACTION      There were no vitals filed for this visit.   Subjective Assessment - 03/19/21 0805     Subjective She reports that stretches are helping to relieve her periscapular pain on the left shoulder blade.    Pertinent History 8/9 Dr. Brita Romp     - longstanding issue  - shoulders are uneven on exam which is likely contributing  - will send to PT for eval and treat    How long can you sit comfortably? Cannot sit >30 min and sitting makes it worse. She does have a standing desk that she uses now    How long can you stand comfortably? Feels more comfortable standing than sitting    How long can you walk comfortably? N/a. Moving generally makes it feel better    Diagnostic tests None    Patient Stated Goals She wants a home exercise plan to self treat condition and ideally wants to resolve the pain.    Currently in Pain? Yes    Pain Score 2     Pain Location Other (Comment)   Shoulder blade   Pain Orientation Left    Pain Descriptors / Indicators Aching            EVAL:  PSIS: Continued posterior rotation of PSIS on Right  and anterior rotation on left.   Left shoulder elevated higher than right shoulder   THEREX:  Shoulder Clocks with Yellow TB 2 x 10 (12, 3, 6) -min VC to maintain tension in band   Shoulder Clocks with Red TB 2 x 10   Push up with plus with 2 x #5 DB 1 x 10   Banded T's with Green TB 1 x 10    Updated HEP and educated patient on changes to exercises and addition of new exercises that include push up rows, shoulder clocks, and progression of banded T's .          PT Education - 03/19/21 0815     Education Details form/technique for appropriate exercise    Person(s) Educated Patient    Methods Explanation;Demonstration;Verbal cues;Handout    Comprehension Verbalized understanding;Verbal cues required              PT Short Term Goals - 03/19/21 1749       PT SHORT TERM GOAL #1   Title Patient will demonstrate understanding of home exercise plan.    Baseline 8/30: NT    Time 2    Period  Weeks    Status New    Target Date 03/19/21               PT Long Term Goals - 03/19/21 0838       PT LONG TERM GOAL #1   Title Patient will demonstrate symmetrical height of PSIS to indicate decreased hip rotation.    Baseline 8/30: Left anterior hip rotation, Right posterior hip rotation    Time 4    Period Weeks    Status New      PT LONG TERM GOAL #2   Title Patient will have improved function and activity level as evidenced by an increase in FOTO score by 10 points or more.    Baseline 03/05/21: 74/79    Time 4    Period Weeks    Status New      PT LONG TERM GOAL #3   Title Patient will exhibit a decrease in numbness and tingling in LUE after prolonged sitting (>2 min).    Baseline 8/30: Pain immediately when sitting down    Time 4    Period Weeks    Status New      PT LONG TERM GOAL #4   Title Patient will decrease height of left shoulder elevation in order to be symmetrical with right shoulder.    Baseline 8/30: Left shoulder elevated compared to right  shoulder (height not measured)    Time 4    Period Weeks    Status New                   Plan - 03/19/21 1610     Clinical Impression Statement Pt presents for f/u for left sided periscapular pain. She continues to report decreased pain in left shoulder blade and ability to resolve pain with periscapular stretches. She does continue to show left anterior rotation of pelvis that has not resolved with muscle energy techniques. Given pt's continued progress and ability to self-manage her symptoms, PT recommends that goals be reassessed next session, and if met, then pt can be discharged with HEP.    Personal Factors and Comorbidities Comorbidity 2    Comorbidities hypothyroidism, HLD    Examination-Activity Limitations Sit    Examination-Participation Restrictions Other   Working at desk for job   Stability/Clinical Decision Making Stable/Uncomplicated    Rehab Potential Excellent    PT Frequency 2x / week    PT Duration 4 weeks    PT Treatment/Interventions Therapeutic activities;Therapeutic exercise;Passive range of motion;Spinal Manipulations;Joint Manipulations;Manual techniques;Cryotherapy;Electrical Stimulation;Moist Heat    PT Next Visit Plan Reassess muscle PSIS and remainder of goals. Progress periscapular strengthening exercises: Bent T's and Y's, Shoulder Stars, Protraction with foam roller.    PT Home Exercise Plan [id number]    [id number] and Agree with Plan of Care Patient             Patient will benefit from skilled therapeutic intervention in order to improve the following deficits and impairments:  Pain, Postural dysfunction, Impaired flexibility, Decreased strength  Visit Diagnosis: Cervicalgia     Problem List Patient Active Problem List   Diagnosis Date Noted   Serum total bilirubin elevated 02/12/2021   Encounter for surveillance of vaginal ring hormonal contraceptive device 07/12/2020   Hyperlipidemia 01/16/2020   Hypothyroidism 11/29/2019    Carpal tunnel syndrome of left wrist 11/11/2019   Vitamin D deficiency 11/10/2019   Allergy to cashew nut 11/10/2019   Bradly Chris PT, DPT  03/19/2021, 8:43 AM  [place]  [place] [facility] [address], [place], [address] Phone: [phone]   Fax:  [phone]  Name: KORRI ASK MRN: [id number] Date of Birth: Aug 25, 1990

## 2021-03-21 ENCOUNTER — Encounter: Payer: BC Managed Care – PPO | Admitting: Physical Therapy

## 2021-03-26 ENCOUNTER — Ambulatory Visit: Payer: BC Managed Care – PPO | Admitting: Physical Therapy

## 2021-03-26 DIAGNOSIS — M542 Cervicalgia: Secondary | ICD-10-CM

## 2021-03-26 NOTE — Therapy (Signed)
[place] [facility] [address], [place], [address] Phone: [phone]   Fax:  [phone]  Physical Therapy Treatment  Patient Details  Name: Cassandra Rojas MRN: [id number] Date of Birth: 03/16/1991 Referring Provider (PT): Lavon Paganini, MD   Encounter Date: 03/26/2021   PT End of Session - 03/26/21 0812     Visit Number 6    Number of Visits 9    Date for PT Re-Evaluation 04/09/21    PT Start Time 0805    PT Stop Time 0830    PT Time Calculation (min) 25 min    Activity Tolerance Patient tolerated treatment well    Behavior During Therapy [facility] for tasks assessed/performed             Past Medical History:  Diagnosis Date   Allergy    Hypothyroid    PCOS (polycystic ovarian syndrome)     Past Surgical History:  Procedure Laterality Date   WISDOM TOOTH EXTRACTION      There were no vitals filed for this visit.   Subjective Assessment - 03/26/21 0837     Subjective Pt reports continued relief of symptoms and even when her symptoms occur after sitting for prolonged periods of time, she is able to decrease pain and tension with ome exercises. Pt feels confident in managing symptoms.    Pertinent History 8/9 Dr. Brita Romp     - longstanding issue  - shoulders are uneven on exam which is likely contributing  - will send to PT for eval and treat    How long can you sit comfortably? Cannot sit >30 min and sitting makes it worse. She does have a standing desk that she uses now    How long can you stand comfortably? Feels more comfortable standing than sitting    How long can you walk comfortably? N/a. Moving generally makes it feel better    Diagnostic tests None    Patient Stated Goals She wants a home exercise plan to self treat condition and ideally wants to resolve the pain.    Currently in Pain? No/denies             EVAL:  Measurement of PSIS: Right posteriorly rotated compared to  left    THEREX:   Review of home exercises and progressions to each exercise so that pt can maintain through self-management.             PT Education - 03/26/21 0836     Education Details form/technique for appropriate exercise    Person(s) Educated Patient    Methods Explanation;Demonstration;Verbal cues;Handout    Comprehension Verbalized understanding;Returned demonstration;Verbal cues required              PT Short Term Goals - 03/26/21 0829       PT SHORT TERM GOAL #1   Title Patient will demonstrate understanding of home exercise plan.    Baseline 8/30: NT    Time 2    Period Weeks    Status Achieved    Target Date 03/19/21               PT Long Term Goals - 03/26/21 0829       PT LONG TERM GOAL #1   Title Patient will demonstrate symmetrical height of PSIS to indicate decreased hip rotation.    Baseline 8/30: Left anterior hip rotation, Right posterior hip rotation 9/20: Hip rotation remains unchanged    Time 4  Period Weeks    Status Not Met      PT LONG TERM GOAL #2   Title Patient will have improved function and activity level as evidenced by an increase in FOTO score by 10 points or more.    Baseline 03/05/21: 74/79 03/26/21: 88/79    Time 4    Period Weeks    Status Achieved      PT LONG TERM GOAL #3   Title Patient will exhibit a decrease in numbness and tingling in LUE after prolonged sitting (>2 min).    Baseline 8/30: Pain immediately when sitting down 9/20: Pain resolved for prolonged sitting, still experience tension but able to resolve.    Time 4    Period Weeks    Status Achieved      PT LONG TERM GOAL #4   Title Patient will decrease height of left shoulder elevation in order to be symmetrical with right shoulder.    Baseline 8/30: Left shoulder elevated compared to right shoulder (height not measured) 9/20: Left and right shoulder are symmetrical    Time 4    Period Weeks    Status Achieved                    Plan - 03/26/21 0813     Clinical Impression Statement Pt presents for f/u for left sided periscapular pain. She has resolved shoulder pain with decreased left shoulder elevation. She has met nearly all her goals with the exception of hip rotation which continues to show posterior rotation on right and anterior rotation on left. PT educated pt on progressions for home exercise plan which pt will continue to follow to manage symptoms and maintain strenght gains. Pt is ready for discharge and well self-manage with HEP.    Personal Factors and Comorbidities Comorbidity 2    Comorbidities hypothyroidism, HLD    Examination-Activity Limitations Sit    Examination-Participation Restrictions Other   Working at desk for job   Stability/Clinical Decision Making Stable/Uncomplicated    Rehab Potential Excellent    PT Frequency 2x / week    PT Duration 4 weeks    PT Treatment/Interventions Therapeutic activities;Therapeutic exercise;Passive range of motion;Spinal Manipulations;Joint Manipulations;Manual techniques;Cryotherapy;Electrical Stimulation;Moist Heat    PT Next Visit Plan N/a    PT Home Exercise Plan [phone]    Consulted and Agree with Plan of Care Patient             Patient will benefit from skilled therapeutic intervention in order to improve the following deficits and impairments:  Pain, Postural dysfunction, Impaired flexibility, Decreased strength  Visit Diagnosis: Cervicalgia     Problem List Patient Active Problem List   Diagnosis Date Noted   Serum total bilirubin elevated 02/12/2021   Encounter for surveillance of vaginal ring hormonal contraceptive device 07/12/2020   Hyperlipidemia 01/16/2020   Hypothyroidism 11/29/2019   Carpal tunnel syndrome of left wrist 11/11/2019   Vitamin D deficiency 11/10/2019   Allergy to cashew nut 11/10/2019   Bradly Chris PT, DPT  03/26/2021, 8:40 AM  [place] [facility] [address] [place], [address] Phone: [phone]   Fax:  [phone]  Name: TORINA EY MRN: [id number] Date of Birth: 1990/07/20

## 2021-03-28 ENCOUNTER — Encounter: Payer: BC Managed Care – PPO | Admitting: Physical Therapy

## 2021-04-02 ENCOUNTER — Encounter: Payer: BC Managed Care – PPO | Admitting: Physical Therapy

## 2021-04-04 ENCOUNTER — Encounter: Payer: BC Managed Care – PPO | Admitting: Physical Therapy

## 2021-04-09 ENCOUNTER — Encounter: Payer: BC Managed Care – PPO | Admitting: Physical Therapy

## 2021-04-11 ENCOUNTER — Encounter: Payer: BC Managed Care – PPO | Admitting: Physical Therapy

## 2021-04-15 ENCOUNTER — Ambulatory Visit: Payer: Self-pay | Admitting: Family Medicine

## 2021-04-16 ENCOUNTER — Encounter: Payer: BC Managed Care – PPO | Admitting: Physical Therapy

## 2021-04-18 ENCOUNTER — Encounter: Payer: BC Managed Care – PPO | Admitting: Physical Therapy

## 2021-04-23 ENCOUNTER — Encounter: Payer: BC Managed Care – PPO | Admitting: Physical Therapy

## 2021-04-25 ENCOUNTER — Encounter: Payer: BC Managed Care – PPO | Admitting: Physical Therapy

## 2021-04-30 ENCOUNTER — Encounter: Payer: BC Managed Care – PPO | Admitting: Physical Therapy

## 2021-05-02 ENCOUNTER — Encounter: Payer: BC Managed Care – PPO | Admitting: Physical Therapy

## 2021-07-23 ENCOUNTER — Telehealth: Payer: Self-pay

## 2021-07-23 DIAGNOSIS — Z3044 Encounter for surveillance of vaginal ring hormonal contraceptive device: Secondary | ICD-10-CM

## 2021-07-23 NOTE — Telephone Encounter (Signed)
[place] faxed refill request for the following medications:  etonogestrel-ethinyl estradiol (NUVARING) 0.12-0.015 MG/24HR vaginal ring    Please advise.

## 2021-07-24 MED ORDER — ETONOGESTREL-ETHINYL ESTRADIOL 0.12-0.015 MG/24HR VA RING: 1.0000 | VAGINAL_RING | VAGINAL | 12 refills | Status: DC

## 2021-08-15 NOTE — Progress Notes (Signed)
Complete physical exam   Patient: Cassandra Rojas   DOB: [birth year]   31 y.o. Female  MRN: [id number] Visit Date: 08/16/2021  Today's healthcare provider: Gwyneth Sprout, FNP   Chief Complaint  Patient presents with   Annual Exam   I,Sulibeya S Dimas,acting as a scribe for Gwyneth Sprout, FNP.,have documented all relevant documentation on the behalf of Gwyneth Sprout, FNP,as directed by  Gwyneth Sprout, FNP while in the presence of Gwyneth Sprout, FNP.  Subjective    Kadyn MADYSUN THALL is a 31 y.o. female who presents today for a complete physical exam.  She reports consuming a general diet. Gym/ health club routine includes cardio. She generally feels well. She reports sleeping well. She does have additional problems to discuss today.  HPI    Past Medical History:  Diagnosis Date   Allergy    Hypothyroid    PCOS (polycystic ovarian syndrome)    Past Surgical History:  Procedure Laterality Date   WISDOM TOOTH EXTRACTION     Social History   Socioeconomic History   Marital status: Married    Spouse name: Not on file   Number of children: Not on file   Years of education: Not on file   Highest education level: Not on file  Occupational History   Not on file  Tobacco Use   Smoking status: Never   Smokeless tobacco: Never  Substance and Sexual Activity   Alcohol use: Yes    Alcohol/week: 4.0 standard drinks    Types: 1 Glasses of wine, 2 Cans of beer, 1 Shots of liquor per week   Drug use: Never   Sexual activity: Yes    Partners: Male    Birth control/protection: Other-see comments    Comment: NuvaRing  Other Topics Concern   Not on file  Social History Narrative   Not on file   Social Determinants of Health   Financial Resource Strain: Not on file  Food Insecurity: Not on file  Transportation Needs: Not on file  Physical Activity: Not on file  Stress: Not on file  Social Connections: Not on file  Intimate Partner Violence: Not on file   Family  Status  Relation Name Status   Mother  Alive   Father  Alive   Brother  Alive   PGM  (Not Specified)   PGF  (Not Specified)   Family History  Problem Relation Age of Onset   Cataracts Mother    Hypercholesterolemia Father    Hypertension Father    Allergies Brother    Heart disease Paternal Grandmother    Heart disease Paternal Grandfather    Parkinson's disease Paternal Grandfather    Allergies  Allergen Reactions   Other Cough    Mold* Cashews & pistachios patient reports swelling of throat   Penicillins Anaphylaxis   Amoxicillin     Patient reports childhood allergy    Patient Care Team: Gwyneth Sprout, FNP as PCP - General (Family Medicine)   Medications: Outpatient Medications Prior to Visit  Medication Sig   EPINEPHrine 0.3 mg/0.3 mL IJ SOAJ injection Inject 0.3 mLs (0.3 mg total) into the muscle as needed for anaphylaxis.   etonogestrel-ethinyl estradiol (NUVARING) 0.12-0.015 MG/24HR vaginal ring Place 1 each vaginally every 28 (twenty-eight) days. Insert vaginally and leave in place for 3 consecutive weeks, then remove for 1 week.   fexofenadine (ALLEGRA) 180 MG tablet 180 mg daily.   levothyroxine (SYNTHROID) 75 MCG tablet TAKE 1  TABLET(75 MCG) BY MOUTH DAILY   No facility-administered medications prior to visit.    Review of Systems  Constitutional: Negative.   HENT: Negative.    Eyes: Negative.   Respiratory: Negative.    Cardiovascular: Negative.   Gastrointestinal:  Positive for abdominal distention, abdominal pain and diarrhea.  Endocrine: Negative.   Genitourinary: Negative.   Musculoskeletal: Negative.   Skin: Negative.   Allergic/Immunologic: Positive for environmental allergies and food allergies.  Neurological: Negative.   Hematological: Negative.   Psychiatric/Behavioral: Negative.     Last CBC Lab Results  Component Value Date   WBC 5.5 10/18/2020   HGB 13.2 10/18/2020   HCT 40.0 10/18/2020   MCV 91 10/18/2020   MCH 30.1 10/18/2020    RDW 11.7 10/18/2020   PLT 236 16/01/3709   Last metabolic panel Lab Results  Component Value Date   GLUCOSE 87 10/18/2020   NA 138 10/18/2020   K 4.1 10/18/2020   CL 102 10/18/2020   CO2 21 10/18/2020   BUN 11 10/18/2020   CREATININE 0.86 10/18/2020   EGFR 92 10/18/2020   CALCIUM 9.3 10/18/2020   PROT 6.6 02/12/2021   ALBUMIN 4.4 02/12/2021   LABGLOB 1.9 10/18/2020   AGRATIO 2.4 (H) 10/18/2020   BILITOT 1.2 02/12/2021   ALKPHOS 50 02/12/2021   AST 22 02/12/2021   ALT 24 02/12/2021   ANIONGAP 8 04/12/2017   Last lipids Lab Results  Component Value Date   CHOL 198 02/12/2021   HDL 79 02/12/2021   LDLCALC 102 (H) 02/12/2021   TRIG 95 02/12/2021   CHOLHDL 2.5 02/12/2021   Last hemoglobin A1c No results found for: HGBA1C Last thyroid functions Lab Results  Component Value Date   TSH 1.920 02/12/2021   Last vitamin D Lab Results  Component Value Date   VD25OH 70.7 11/25/2019      Objective    BP 125/79 (BP Location: Left Arm, Patient Position: Sitting, Cuff Size: Normal)    Pulse (!) 58    Temp 97.9 F (36.6 C) (Temporal)    Resp 16    Ht 5' 2"  (1.575 m)    Wt 119 lb 4.8 oz (54.1 kg)    SpO2 100%    BMI 21.82 kg/m  BP Readings from Last 3 Encounters:  08/16/21 125/79  02/12/21 119/77  10/18/20 132/83   Wt Readings from Last 3 Encounters:  08/16/21 119 lb 4.8 oz (54.1 kg)  02/12/21 127 lb 12.8 oz (58 kg)  10/18/20 128 lb 3.2 oz (58.2 kg)      Physical Exam Vitals and nursing note reviewed.  Constitutional:      General: She is not in acute distress.    Appearance: Normal appearance. She is normal weight. She is not ill-appearing, toxic-appearing or diaphoretic.  HENT:     Head: Normocephalic and atraumatic.     Right Ear: Tympanic membrane, ear canal and external ear normal.     Left Ear: Tympanic membrane, ear canal and external ear normal.     Nose: Nose normal.     Mouth/Throat:     Mouth: Mucous membranes are moist.     Pharynx: Oropharynx  is clear.  Eyes:     Extraocular Movements: Extraocular movements intact.     Conjunctiva/sclera: Conjunctivae normal.     Pupils: Pupils are equal, round, and reactive to light.  Cardiovascular:     Rate and Rhythm: Normal rate and regular rhythm.     Pulses: Normal pulses.     Heart  sounds: Normal heart sounds. No murmur heard.   No friction rub. No gallop.  Pulmonary:     Effort: Pulmonary effort is normal. No respiratory distress.     Breath sounds: Normal breath sounds. No stridor. No wheezing, rhonchi or rales.  Chest:     Chest wall: No tenderness.  Abdominal:     General: Abdomen is flat. Bowel sounds are normal.     Palpations: Abdomen is soft.  Musculoskeletal:        General: No swelling, tenderness, deformity or signs of injury. Normal range of motion.     Cervical back: Normal range of motion and neck supple.     Right lower leg: No edema.     Left lower leg: No edema.  Skin:    General: Skin is warm and dry.     Capillary Refill: Capillary refill takes less than 2 seconds.     Coloration: Skin is not jaundiced or pale.     Findings: No bruising, erythema, lesion or rash.  Neurological:     General: No focal deficit present.     Mental Status: She is alert and oriented to person, place, and time. Mental status is at baseline.     Cranial Nerves: No cranial nerve deficit.     Sensory: No sensory deficit.     Motor: No weakness.     Coordination: Coordination normal.  Psychiatric:        Mood and Affect: Mood normal.        Behavior: Behavior normal.        Thought Content: Thought content normal.        Judgment: Judgment normal.     Last depression screening scores PHQ 2/9 Scores 02/12/2021 11/10/2019  PHQ - 2 Score 0 2  PHQ- 9 Score 2 9   Last fall risk screening Fall Risk  11/10/2019  Falls in the past year? 0  Number falls in past yr: 0  Injury with Fall? 0   Last Audit-C alcohol use screening Alcohol Use Disorder Test (AUDIT) 11/10/2019  1. How often do  you have a drink containing alcohol? 3  2. How many drinks containing alcohol do you have on a typical day when you are drinking? 0  3. How often do you have six or more drinks on one occasion? 0  AUDIT-C Score 3   A score of 3 or more in women, and 4 or more in men indicates increased risk for alcohol abuse, EXCEPT if all of the points are from question 1   No results found for any visits on 08/16/21.  Assessment & Plan    Routine Health Maintenance and Physical Exam  Exercise Activities and Dietary recommendations  Goals   None     Immunization History  Administered Date(s) Administered   Moderna Sars-Covid-2 Vaccination 09/09/2019, 10/07/2019, 07/13/2020   Tdap 08/16/2021    Health Maintenance  Topic Date Due   Hepatitis C Screening  Never done   COVID-19 Vaccine (4 - Booster for Moderna series) 09/07/2020   INFLUENZA VACCINE  10/04/2021 (Originally 02/04/2021)   PAP SMEAR-Modifier  12/08/2021   TETANUS/TDAP  08/17/2031   HIV Screening  Completed   HPV VACCINES  Aged Out    Discussed health benefits of physical activity, and encouraged her to engage in regular exercise appropriate for her age and condition.  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  - Eat a low-fat diet with lots of  fruits and vegetables, up to 7-9 servings per day.  - Seatbelts can save your life. Wear them always.  - Smoke detectors on every level of your home, check batteries every year.  - Eye Doctor - have an eye exam every 1-2 years  - Safe sex - if you may be exposed to STDs, use a condom.  - Alcohol -  If you drink, do it moderately, less than 2 drinks per day.  - [place]. Choose someone to speak for you if you are not able.  - Depression is common in our stressful world.If you're feeling down or losing interest in things you normally enjoy, please come in for a visit.  - Violence - If anyone is threatening or hurting you, please call immediately.        Relevant Orders   Comprehensive metabolic panel   Lipid Panel With LDL/HDL Ratio   TSH   Screening for HIV (human immunodeficiency virus)   Relevant Orders   HIV Antibody (routine testing w rflx)   Encounter for hepatitis C screening test for low risk patient   Relevant Orders   Hepatitis C antibody   Need for Tdap vaccination   Relevant Orders   Tdap vaccine greater than or equal to 7yo IM (Completed)     Return in about 1 year (around 08/16/2022) for annual examination.     Vonna Kotyk, FNP, have reviewed all documentation for this visit. The documentation on 08/16/21 for the exam, diagnosis, procedures, and orders are all accurate and complete.    Gwyneth Sprout, [place] [phone] (phone) [phone] (fax)  [place]

## 2021-08-16 ENCOUNTER — Other Ambulatory Visit: Payer: Self-pay

## 2021-08-16 ENCOUNTER — Encounter: Payer: Self-pay | Admitting: Family Medicine

## 2021-08-16 ENCOUNTER — Ambulatory Visit (INDEPENDENT_AMBULATORY_CARE_PROVIDER_SITE_OTHER): Payer: BC Managed Care – PPO | Admitting: Family Medicine

## 2021-08-16 VITALS — BP 125/79 | HR 58 | Temp 97.9°F | Resp 16 | Ht 62.0 in | Wt 119.3 lb

## 2021-08-16 DIAGNOSIS — Z23 Encounter for immunization: Secondary | ICD-10-CM | POA: Diagnosis not present

## 2021-08-16 DIAGNOSIS — E559 Vitamin D deficiency, unspecified: Secondary | ICD-10-CM | POA: Diagnosis not present

## 2021-08-16 DIAGNOSIS — E039 Hypothyroidism, unspecified: Secondary | ICD-10-CM

## 2021-08-16 DIAGNOSIS — Z0000 Encounter for general adult medical examination without abnormal findings: Secondary | ICD-10-CM | POA: Insufficient documentation

## 2021-08-16 DIAGNOSIS — Z1159 Encounter for screening for other viral diseases: Secondary | ICD-10-CM

## 2021-08-16 DIAGNOSIS — E785 Hyperlipidemia, unspecified: Secondary | ICD-10-CM | POA: Diagnosis not present

## 2021-08-16 DIAGNOSIS — Z114 Encounter for screening for human immunodeficiency virus [HIV]: Secondary | ICD-10-CM | POA: Diagnosis not present

## 2021-08-16 NOTE — Assessment & Plan Note (Signed)
Repeat labs:

## 2021-08-16 NOTE — Assessment & Plan Note (Signed)
Slight thinning of hair around temples Increased bowel habits in evenings Has tried to modify diet

## 2021-08-16 NOTE — Assessment & Plan Note (Signed)
Repeat labs, fasting recommend diet low in saturated fat and regular exercise - 30 min at least 5 times per week

## 2021-08-16 NOTE — Assessment & Plan Note (Signed)
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  - Eat a low-fat diet with lots of fruits and vegetables, up to 7-9 servings per day.  - Seatbelts can save your life. Wear them always.  - Smoke detectors on every level of your home, check batteries every year.  - Eye Doctor - have an eye exam every 1-2 years  - Safe sex - if you may be exposed to STDs, use a condom.  - Alcohol -  If you drink, do it moderately, less than 2 drinks per day.  - [place]. Choose someone to speak for you if you are not able.  - Depression is common in our stressful world.If you're feeling down or losing interest in things you normally enjoy, please come in for a visit.  - Violence - If anyone is threatening or hurting you, please call immediately.

## 2021-08-17 LAB — COMPREHENSIVE METABOLIC PANEL
ALT: 22 IU/L (ref 0–32)
AST: 19 IU/L (ref 0–40)
Albumin/Globulin Ratio: 1.9 (ref 1.2–2.2)
Albumin: 4.4 g/dL (ref 3.8–4.8)
Alkaline Phosphatase: 42 IU/L — ABNORMAL LOW (ref 44–121)
BUN/Creatinine Ratio: 9 (ref 9–23)
BUN: 7 mg/dL (ref 6–20)
Bilirubin Total: 0.8 mg/dL (ref 0.0–1.2)
CO2: 23 mmol/L (ref 20–29)
Calcium: 9.3 mg/dL (ref 8.7–10.2)
Chloride: 103 mmol/L (ref 96–106)
Creatinine, Ser: 0.78 mg/dL (ref 0.57–1.00)
Globulin, Total: 2.3 g/dL (ref 1.5–4.5)
Glucose: 83 mg/dL (ref 70–99)
Potassium: 4.4 mmol/L (ref 3.5–5.2)
Sodium: 138 mmol/L (ref 134–144)
Total Protein: 6.7 g/dL (ref 6.0–8.5)
eGFR: 103 mL/min/{1.73_m2} (ref >59)

## 2021-08-17 LAB — LIPID PANEL WITH LDL/HDL RATIO
Cholesterol, Total: 196 mg/dL (ref 100–199)
HDL: 88 mg/dL (ref >39)
LDL Chol Calc (NIH): 93 mg/dL (ref 0–99)
LDL/HDL Ratio: 1.1 ratio (ref 0.0–3.2)
Triglycerides: 82 mg/dL (ref 0–149)
VLDL Cholesterol Cal: 15 mg/dL (ref 5–40)

## 2021-08-17 LAB — HIV ANTIBODY (ROUTINE TESTING W REFLEX): HIV Screen 4th Generation wRfx: NONREACTIVE

## 2021-08-17 LAB — VITAMIN D 25 HYDROXY (VIT D DEFICIENCY, FRACTURES): Vit D, 25-Hydroxy: 55.3 ng/mL (ref 30.0–100.0)

## 2021-08-17 LAB — HEPATITIS C ANTIBODY: Hep C Virus Ab: <0.1 s/co ratio (ref 0.0–0.9)

## 2021-08-17 LAB — TSH+FREE T4
Free T4: 1.81 ng/dL — ABNORMAL HIGH (ref 0.82–1.77)
TSH: 1.71 u[IU]/mL (ref 0.450–4.500)

## 2021-08-19 ENCOUNTER — Other Ambulatory Visit: Payer: Self-pay | Admitting: Family Medicine

## 2021-08-19 MED ORDER — LEVOTHYROXINE SODIUM 75 MCG PO TABS: ORAL_TABLET | ORAL | 3 refills | Status: DC

## 2022-05-13 NOTE — Progress Notes (Deleted)
      Established patient visit   Patient: Cassandra Rojas   DOB: 1991-05-26   31 y.o. Female  MRN: [id number] Visit Date: 05/15/2022  Today's healthcare provider: Gwyneth Sprout, FNP   No chief complaint on file.  Subjective    HPI  ***  Medications: Outpatient Medications Prior to Visit  Medication Sig   EPINEPHrine 0.3 mg/0.3 mL IJ SOAJ injection Inject 0.3 mLs (0.3 mg total) into the muscle as needed for anaphylaxis.   etonogestrel-ethinyl estradiol (NUVARING) 0.12-0.015 MG/24HR vaginal ring Place 1 each vaginally every 28 (twenty-eight) days. Insert vaginally and leave in place for 3 consecutive weeks, then remove for 1 week.   fexofenadine (ALLEGRA) 180 MG tablet 180 mg daily.   levothyroxine (SYNTHROID) 75 MCG tablet TAKE 1 TABLET(75 MCG) BY MOUTH DAILY   No facility-administered medications prior to visit.    Review of Systems  {Labs  Heme  Chem  Endocrine  Serology  Results Review (optional):23779}   Objective    There were no vitals taken for this visit. {Show previous vital signs (optional):23777}  Physical Exam  ***  No results found for any visits on 05/15/22.  Assessment & Plan     ***  No follow-ups on file.      {provider attestation***:1}   Gwyneth Sprout, [place] [phone] (phone) [phone] (fax)  [place]

## 2022-05-14 ENCOUNTER — Encounter: Payer: Self-pay | Admitting: Family Medicine

## 2022-05-14 ENCOUNTER — Ambulatory Visit: Payer: BC Managed Care – PPO | Admitting: Family Medicine

## 2022-05-14 VITALS — BP 123/57 | HR 67 | Temp 98.5°F | Resp 16 | Wt 126.0 lb

## 2022-05-14 DIAGNOSIS — L0100 Impetigo, unspecified: Secondary | ICD-10-CM | POA: Diagnosis not present

## 2022-05-14 MED ORDER — CLOBETASOL PROPIONATE 0.05 % EX OINT: 1.0000 | TOPICAL_OINTMENT | Freq: Two times a day (BID) | CUTANEOUS | 0 refills | Status: DC

## 2022-05-14 MED ORDER — PREDNISONE 50 MG PO TABS: 50.0000 mg | ORAL_TABLET | Freq: Every day | ORAL | 0 refills | Status: DC

## 2022-05-14 NOTE — Assessment & Plan Note (Addendum)
Acute <7 days Denies travel  Exposure to leaves; unaware of any insect bites Spouse does not have any similar symptoms Had tele-doc visit with plan for topical abx cream and doxy for cellulitis cream Continue doxy; add 5 day burst of steroids and steroid cream Encouraged to use barrier protection given use of doxy Continue to monitor Advised of red flag symptoms and call parameters to change Abx if necessary

## 2022-05-14 NOTE — Progress Notes (Signed)
I,Sulibeya S Dimas,acting as a Neurosurgeon for Jacky Kindle, FNP.,have documented all relevant documentation on the behalf of Jacky Kindle, FNP,as directed by  Jacky Kindle, FNP while in the presence of Jacky Kindle, FNP.   Established patient visit  Patient: Cassandra Rojas   DOB: 1991/02/13   31 y.o. Female  MRN: [id number] Visit Date: 05/14/2022  Today's healthcare provider: Jacky Kindle, FNP  Re Introduced to nurse practitioner role and practice setting.  All questions answered.  Discussed provider/patient relationship and expectations.  Chief Complaint  Patient presents with   Rash   Subjective    Rash This is a new problem. The current episode started in the past 7 days. The problem has been gradually worsening since onset. The affected locations include the right arm. The rash is characterized by redness, swelling, blistering, draining, itchiness and pain. She was exposed to nothing. Pertinent negatives include no congestion, cough, fever, rhinorrhea or shortness of breath. Past treatments include antibiotics, antibiotic cream and anti-itch cream. The treatment provided mild relief.  patient did a virtual visit and was prescribed doxy and mupirocin ointment.   Medications: Outpatient Medications Prior to Visit  Medication Sig   doxycycline (MONODOX) 100 MG capsule Take 100 mg by mouth 2 (two) times daily.   EPINEPHrine 0.3 mg/0.3 mL IJ SOAJ injection Inject 0.3 mLs (0.3 mg total) into the muscle as needed for anaphylaxis.   etonogestrel-ethinyl estradiol (NUVARING) 0.12-0.015 MG/24HR vaginal ring Place 1 each vaginally every 28 (twenty-eight) days. Insert vaginally and leave in place for 3 consecutive weeks, then remove for 1 week.   fexofenadine (ALLEGRA) 180 MG tablet 180 mg daily.   levothyroxine (SYNTHROID) 75 MCG tablet TAKE 1 TABLET(75 MCG) BY MOUTH DAILY   mupirocin ointment (BACTROBAN) 2 % Apply topically 3 (three) times daily.   No facility-administered  medications prior to visit.    Review of Systems  Constitutional:  Negative for chills and fever.  HENT:  Negative for congestion and rhinorrhea.   Respiratory:  Negative for cough and shortness of breath.   Gastrointestinal:  Negative for abdominal pain and nausea.  Skin:  Positive for color change and rash.     Objective    BP (!) 123/57 (BP Location: Left Arm, Patient Position: Sitting, Cuff Size: Normal)   Pulse 67   Temp 98.5 F (36.9 C) (Oral)   Resp 16   Wt 126 lb (57.2 kg)   BMI 23.05 kg/m   Physical Exam Vitals and nursing note reviewed.  Constitutional:      General: She is not in acute distress.    Appearance: Normal appearance. She is normal weight. She is not ill-appearing, toxic-appearing or diaphoretic.  HENT:     Head: Normocephalic and atraumatic.  Cardiovascular:     Rate and Rhythm: Normal rate and regular rhythm.     Pulses: Normal pulses.     Heart sounds: Normal heart sounds. No murmur heard.    No friction rub. No gallop.  Pulmonary:     Effort: Pulmonary effort is normal. No respiratory distress.     Breath sounds: Normal breath sounds. No stridor. No wheezing, rhonchi or rales.  Chest:     Chest wall: No tenderness.  Musculoskeletal:        General: No swelling, tenderness, deformity or signs of injury. Normal range of motion.     Right lower leg: No edema.     Left lower leg: No edema.  Skin:  General: Skin is warm and dry.     Capillary Refill: Capillary refill takes less than 2 seconds.     Coloration: Skin is not jaundiced or pale.     Findings: Erythema, lesion and rash present. No bruising.       Neurological:     General: No focal deficit present.     Mental Status: She is alert and oriented to person, place, and time. Mental status is at baseline.     Cranial Nerves: No cranial nerve deficit.     Sensory: No sensory deficit.     Motor: No weakness.     Coordination: Coordination normal.  Psychiatric:        Mood and Affect:  Mood normal.        Behavior: Behavior normal.        Thought Content: Thought content normal.        Judgment: Judgment normal.    No results found for any visits on 05/14/22.  Assessment & Plan     Problem List Items Addressed This Visit       Musculoskeletal and Integument   Impetigo - Primary    Acute <7 days Denies travel  Exposure to leaves; unaware of any insect bites Spouse does not have any similar symptoms Had tele-doc visit with plan for topical abx cream and doxy for cellulitis cream Continue doxy; add 5 day burst of steroids and steroid cream Encouraged to use barrier protection given use of doxy Continue to monitor Advised of red flag symptoms and call parameters to change Abx if necessary       Relevant Medications   mupirocin ointment (BACTROBAN) 2 %   predniSONE (DELTASONE) 50 MG tablet   clobetasol ointment (TEMOVATE) 0.05 %   Return if symptoms worsen or fail to improve by Friday.     Leilani Merl, FNP, have reviewed all documentation for this visit. The documentation on 05/14/22 for the exam, diagnosis, procedures, and orders are all accurate and complete.  Jacky Kindle, FNP  [facility] [phone] (phone) [phone] (fax)  [facility]

## 2022-05-15 ENCOUNTER — Ambulatory Visit: Payer: BC Managed Care – PPO | Admitting: Family Medicine

## 2022-05-19 ENCOUNTER — Encounter: Payer: Self-pay | Admitting: Family Medicine

## 2022-05-23 ENCOUNTER — Other Ambulatory Visit: Payer: Self-pay | Admitting: Family Medicine

## 2022-05-23 DIAGNOSIS — T7840XD Allergy, unspecified, subsequent encounter: Secondary | ICD-10-CM

## 2022-05-23 DIAGNOSIS — L209 Atopic dermatitis, unspecified: Secondary | ICD-10-CM

## 2022-06-25 DIAGNOSIS — D225 Melanocytic nevi of trunk: Secondary | ICD-10-CM | POA: Diagnosis not present

## 2022-06-25 DIAGNOSIS — D2262 Melanocytic nevi of left upper limb, including shoulder: Secondary | ICD-10-CM | POA: Diagnosis not present

## 2022-06-25 DIAGNOSIS — D2261 Melanocytic nevi of right upper limb, including shoulder: Secondary | ICD-10-CM | POA: Diagnosis not present

## 2022-06-25 DIAGNOSIS — S50861A Insect bite (nonvenomous) of right forearm, initial encounter: Secondary | ICD-10-CM | POA: Diagnosis not present

## 2022-08-15 ENCOUNTER — Other Ambulatory Visit: Payer: Self-pay | Admitting: Family Medicine

## 2022-08-15 DIAGNOSIS — Z3044 Encounter for surveillance of vaginal ring hormonal contraceptive device: Secondary | ICD-10-CM

## 2022-08-15 NOTE — Telephone Encounter (Signed)
Requested Prescriptions  Pending Prescriptions Disp Refills   NUVARING 0.12-0.015 MG/24HR vaginal ring [Pharmacy Med Name: NUVARING] 1 each 12    Sig: INSERT 1 RING VAGINALLY AND LEAVE IN PLACE FOR 3 CONSECUTIVE WEEKS THEN REMOVE FOR 1 WEEK     OB/GYN:  Contraceptives Passed - 08/15/2022  3:42 AM      Passed - Last BP in normal range    BP Readings from Last 1 Encounters:  05/14/22 (!) 123/57         Passed - Valid encounter within last 12 months    Recent Outpatient Visits           3 months ago [place] Gwyneth Sprout, FNP   12 months ago Encounter for health maintenance examination   [facility] Tally Joe T, FNP   1 year ago Hypothyroidism, unspecified type   [place] Brita Romp, Dionne Bucy, MD   1 year ago Hypothyroidism, unspecified type   [place] Fl[place], Kelby Aline, FNP   2 years ago Hypothyroidism, unspecified type   [place], Kelby Aline, FNP              Passed - Patient is not a smoker

## 2022-08-29 ENCOUNTER — Other Ambulatory Visit: Payer: Self-pay | Admitting: Family Medicine

## 2022-09-24 ENCOUNTER — Ambulatory Visit: Payer: BC Managed Care – PPO | Admitting: Family Medicine

## 2022-09-24 NOTE — Progress Notes (Unsigned)
I,J'ya E Ricketta Colantonio,acting as a scribe for Gwyneth Sprout, FNP.,have documented all relevant documentation on the behalf of Gwyneth Sprout, FNP,as directed by  Gwyneth Sprout, FNP while in the presence of Gwyneth Sprout, FNP.  Complete physical exam  Patient: Cassandra Rojas   DOB: 1990/11/29   32 y.o. Female  MRN: [id number] Visit Date: 09/25/2022  Today's healthcare provider: Gwyneth Sprout, FNP  Re Introduced to nurse practitioner role and practice setting.  All questions answered.  Discussed provider/patient relationship and expectations.  Chief Complaint  Patient presents with   Annual Exam   Subjective    Cassandra Rojas is a 32 y.o. female who presents today for a complete physical exam.  She reports consuming a general diet. Home exercise routine includes stretching and walking 30 to 40 minutes hrs per day. She generally feels well. She reports sleeping well. She does have additional problems to discuss today.   Patient wants to know if its okay to take magnesium pill long-term for sleep. She also mentions that previously using melatonin but left her feeling "groggy".  HPI   Past Medical History:  Diagnosis Date   Allergy    Hypothyroid    PCOS (polycystic ovarian syndrome)    Past Surgical History:  Procedure Laterality Date   WISDOM TOOTH EXTRACTION     Social History   Socioeconomic History   Marital status: Married    Spouse name: Not on file   Number of children: Not on file   Years of education: Not on file   Highest education level: Bachelor's degree (e.g., BA, AB, BS)  Occupational History   Not on file  Tobacco Use   Smoking status: Never   Smokeless tobacco: Never  Substance and Sexual Activity   Alcohol use: Yes    Alcohol/week: 4.0 standard drinks of alcohol    Types: 1 Glasses of wine, 2 Cans of beer, 1 Shots of liquor per week   Drug use: Never   Sexual activity: Yes    Partners: Male    Birth control/protection: Other-see comments     Comment: NuvaRing  Other Topics Concern   Not on file  Social History Narrative   Not on file   Social Determinants of Health   Financial Resource Strain: Low Risk  (09/24/2022)   Overall Financial Resource Strain (CARDIA)    Difficulty of Paying Living Expenses: Not very hard  Food Insecurity: No Food Insecurity (09/24/2022)   Hunger Vital Sign    Worried About Running Out of Food in the Last Year: Never true    Ran Out of Food in the Last Year: Never true  Transportation Needs: No Transportation Needs (09/24/2022)   PRAPARE - Hydrologist (Medical): No    Lack of Transportation (Non-Medical): No  Physical Activity: Insufficiently Active (09/24/2022)   Exercise Vital Sign    Days of Exercise per Week: 6 days    Minutes of Exercise per Session: 20 min  Stress: No Stress Concern Present (09/24/2022)   [place]    Feeling of Stress : Only a little  Social Connections: Moderately Isolated (09/24/2022)   Social Connection and Isolation Panel [NHANES]    Frequency of Communication with Friends and Family: More than three times a week    Frequency of Social Gatherings with Friends and Family: Once a week    Attends Religious Services: Never    Retail buyer of  Clubs or Organizations: No    Attends Music therapist: Not on file    Marital Status: Married  Human resources officer Violence: Not on file   Family Status  Relation Name Status   Mother  Alive   Father  Alive   Brother  Alive   PGM  (Not Specified)   PGF  (Not Specified)   Family History  Problem Relation Age of Onset   Cataracts Mother    Hypercholesterolemia Father    Hypertension Father    Allergies Brother    Heart disease Paternal Grandmother    Heart disease Paternal Grandfather    Parkinson's disease Paternal Grandfather    Allergies  Allergen Reactions   Other Cough    Mold* Cashews & pistachios patient  reports swelling of throat   Penicillins Anaphylaxis   Amoxicillin     Patient reports childhood allergy    Patient Care Team: Gwyneth Sprout, FNP as PCP - General (Family Medicine)   Medications: Outpatient Medications Prior to Visit  Medication Sig   EPINEPHrine 0.3 mg/0.3 mL IJ SOAJ injection Inject 0.3 mLs (0.3 mg total) into the muscle as needed for anaphylaxis.   fexofenadine (ALLEGRA) 180 MG tablet 180 mg daily.   [DISCONTINUED] etonogestrel-ethinyl estradiol (NUVARING) 0.12-0.015 MG/24HR vaginal ring INSERT 1 RING VAGINALLY AND LEAVE IN PLACE FOR 3 CONSECUTIVE WEEKS THEN REMOVE FOR 1 WEEK   [DISCONTINUED] levothyroxine (SYNTHROID) 75 MCG tablet TAKE 1 TABLET(75 MCG) BY MOUTH DAILY   [DISCONTINUED] clobetasol ointment (TEMOVATE) [id number] % Apply 1 Application topically 2 (two) times daily. Apply to R forearm for 7 days, twice a day.   [DISCONTINUED] doxycycline (MONODOX) 100 MG capsule Take 100 mg by mouth 2 (two) times daily.   [DISCONTINUED] mupirocin ointment (BACTROBAN) 2 % Apply topically 3 (three) times daily.   [DISCONTINUED] predniSONE (DELTASONE) 50 MG tablet Take 1 tablet (50 mg total) by mouth daily with breakfast. Take 1 tablet, PO, for 5 days.   No facility-administered medications prior to visit.    Review of Systems   Objective    BP 126/80 (BP Location: Right Arm, Patient Position: Sitting, Cuff Size: Normal)   Pulse 67   Temp 98.6 F (37 C) (Oral)   Resp 14   Ht 5\' 2"  (1.575 m)   Wt 124 lb (56.2 kg)   LMP 09/11/2022 (Approximate)   SpO2 100%   BMI 22.68 kg/m   Physical Exam Vitals and nursing note reviewed.  Constitutional:      General: She is not in acute distress.    Appearance: Normal appearance. She is normal weight. She is not ill-appearing, toxic-appearing or diaphoretic.  HENT:     Head: Normocephalic and atraumatic.  Cardiovascular:     Rate and Rhythm: Normal rate and regular rhythm.     Pulses: Normal pulses.     Heart sounds: Normal  heart sounds. No murmur heard.    No friction rub. No gallop.  Pulmonary:     Effort: Pulmonary effort is normal. No respiratory distress.     Breath sounds: Normal breath sounds. No stridor. No wheezing, rhonchi or rales.  Chest:     Chest wall: No tenderness.  Musculoskeletal:        General: No swelling, tenderness, deformity or signs of injury. Normal range of motion.     Right lower leg: No edema.     Left lower leg: No edema.  Skin:    General: Skin is warm and dry.     Capillary  Refill: Capillary refill takes less than 2 seconds.     Coloration: Skin is not jaundiced or pale.     Findings: No bruising, erythema, lesion or rash.  Neurological:     General: No focal deficit present.     Mental Status: She is alert and oriented to person, place, and time. Mental status is at baseline.     Cranial Nerves: No cranial nerve deficit.     Sensory: No sensory deficit.     Motor: No weakness.     Coordination: Coordination normal.  Psychiatric:        Mood and Affect: Mood normal.        Behavior: Behavior normal.        Thought Content: Thought content normal.        Judgment: Judgment normal.     Last depression screening scores    09/25/2022    8:16 AM 05/14/2022    9:30 AM 02/12/2021    8:18 AM  PHQ 2/9 Scores  PHQ - 2 Score 0 0 0  PHQ- 9 Score 3 1 2    Last fall risk screening    09/25/2022    8:16 AM  Fall Risk   Falls in the past year? 0  Risk for fall due to : No Fall Risks   Last Audit-C alcohol use screening    09/25/2022    8:16 AM  Alcohol Use Disorder Test (AUDIT)  1. How often do you have a drink containing alcohol? 2  2. How many drinks containing alcohol do you have on a typical day when you are drinking? 0  3. How often do you have six or more drinks on one occasion? 0  AUDIT-C Score 2   A score of 3 or more in women, and 4 or more in men indicates increased risk for alcohol abuse, EXCEPT if all of the points are from question 1   No results found  for any visits on 09/25/22.  Assessment & Plan    Routine Health Maintenance and Physical Exam  Exercise Activities and Dietary recommendations  Goals   None     Immunization History  Administered Date(s) Administered   Moderna Sars-Covid-2 Vaccination 09/09/2019, 10/07/2019, 07/13/2020   Tdap 08/16/2021    Health Maintenance  Topic Date Due   PAP SMEAR-Modifier  12/08/2021   COVID-19 Vaccine (4 - 2023-24 season) 03/07/2022   INFLUENZA VACCINE  10/05/2022 (Originally 02/04/2022)   DTaP/Tdap/Td (2 - Td or Tdap) 08/17/2031   Hepatitis C Screening  Completed   HIV Screening  Completed   HPV VACCINES  Aged Out    Discussed health benefits of physical activity, and encouraged her to engage in regular exercise appropriate for her age and condition.  Problem List Items Addressed This Visit       Endocrine   Hypothyroidism    Chronic, stable Repeat labs per patient request Remains on 75 mcg on synthroid Reports some sleep disturbances and hair thinning       Relevant Medications   levothyroxine (SYNTHROID) 75 MCG tablet   Other Relevant Orders   TSH     Other   Encounter for surveillance of vaginal ring hormonal contraceptive device    Chronic, stable Denies complaints with current use Refill provided       Relevant Medications   etonogestrel-ethinyl estradiol (NUVARING) 0.12-0.015 MG/24HR vaginal ring   Other Relevant Orders   Comprehensive Metabolic Panel (CMET)   CBC   Hyperlipidemia - Primary    Chronic,  previously stable with diet/exercise Surveillance labs        Relevant Orders   Comprehensive Metabolic Panel (CMET)   Lipid panel   CBC   Thinning hair    Acute, along hair line Known thyroid issues Recommend Vit B and Vit D      Relevant Orders   Vitamin D (25 hydroxy)   B12 and Folate Panel   Vitamin D deficiency    Chronic, previously stable Off supplements Endorses hair thinning      Relevant Orders   Vitamin D (25 hydroxy)   Return  if symptoms worsen or fail to improve, for PAP as needed .    Vonna Kotyk, FNP, have reviewed all documentation for this visit. The documentation on 09/25/22 for the exam, diagnosis, procedures, and orders are all accurate and complete.  Gwyneth Sprout, [place] [phone] (phone) [phone] (fax)  [place]

## 2022-09-25 ENCOUNTER — Ambulatory Visit: Payer: BC Managed Care – PPO | Admitting: Family Medicine

## 2022-09-25 ENCOUNTER — Encounter: Payer: Self-pay | Admitting: Family Medicine

## 2022-09-25 VITALS — BP 126/80 | HR 67 | Temp 98.6°F | Resp 14 | Ht 62.0 in | Wt 124.0 lb

## 2022-09-25 DIAGNOSIS — E039 Hypothyroidism, unspecified: Secondary | ICD-10-CM

## 2022-09-25 DIAGNOSIS — E782 Mixed hyperlipidemia: Secondary | ICD-10-CM | POA: Diagnosis not present

## 2022-09-25 DIAGNOSIS — Z3044 Encounter for surveillance of vaginal ring hormonal contraceptive device: Secondary | ICD-10-CM

## 2022-09-25 DIAGNOSIS — L659 Nonscarring hair loss, unspecified: Secondary | ICD-10-CM | POA: Diagnosis not present

## 2022-09-25 DIAGNOSIS — E785 Hyperlipidemia, unspecified: Secondary | ICD-10-CM

## 2022-09-25 DIAGNOSIS — E559 Vitamin D deficiency, unspecified: Secondary | ICD-10-CM | POA: Diagnosis not present

## 2022-09-25 DIAGNOSIS — Z0000 Encounter for general adult medical examination without abnormal findings: Secondary | ICD-10-CM | POA: Diagnosis not present

## 2022-09-25 MED ORDER — ETONOGESTREL-ETHINYL ESTRADIOL 0.12-0.015 MG/24HR VA RING: 1.0000 | VAGINAL_RING | VAGINAL | 12 refills | Status: DC

## 2022-09-25 MED ORDER — LEVOTHYROXINE SODIUM 75 MCG PO TABS: 75.0000 ug | ORAL_TABLET | Freq: Every day | ORAL | 3 refills | Status: DC

## 2022-09-25 NOTE — Assessment & Plan Note (Signed)
Chronic, previously stable Off supplements Endorses hair thinning

## 2022-09-25 NOTE — Assessment & Plan Note (Signed)
Chronic, previously stable with diet/exercise Surveillance labs

## 2022-09-25 NOTE — Assessment & Plan Note (Signed)
Chronic, stable Repeat labs per patient request Remains on 75 mcg on synthroid Reports some sleep disturbances and hair thinning

## 2022-09-25 NOTE — Assessment & Plan Note (Signed)
Acute, along hair line Known thyroid issues Recommend Vit B and Vit D

## 2022-09-25 NOTE — Assessment & Plan Note (Signed)
Chronic, stable Denies complaints with current use Refill provided

## 2022-09-26 LAB — COMPREHENSIVE METABOLIC PANEL
ALT: 21 IU/L (ref 0–32)
AST: 17 IU/L (ref 0–40)
Albumin/Globulin Ratio: 2 (ref 1.2–2.2)
Albumin: 4.2 g/dL (ref 3.9–4.9)
Alkaline Phosphatase: 47 IU/L (ref 44–121)
BUN/Creatinine Ratio: 12 (ref 9–23)
BUN: 11 mg/dL (ref 6–20)
Bilirubin Total: 1 mg/dL (ref 0.0–1.2)
CO2: 22 mmol/L (ref 20–29)
Calcium: 9.5 mg/dL (ref 8.7–10.2)
Chloride: 104 mmol/L (ref 96–106)
Creatinine, Ser: 0.89 mg/dL (ref 0.57–1.00)
Globulin, Total: 2.1 g/dL (ref 1.5–4.5)
Glucose: 87 mg/dL (ref 70–99)
Potassium: 4.3 mmol/L (ref 3.5–5.2)
Sodium: 140 mmol/L (ref 134–144)
Total Protein: 6.3 g/dL (ref 6.0–8.5)
eGFR: 87 mL/min/{1.73_m2} (ref >59)

## 2022-09-26 LAB — LIPID PANEL
Chol/HDL Ratio: 2.4 ratio (ref 0.0–4.4)
Cholesterol, Total: 172 mg/dL (ref 100–199)
HDL: 72 mg/dL (ref >39)
LDL Chol Calc (NIH): 84 mg/dL (ref 0–99)
Triglycerides: 85 mg/dL (ref 0–149)
VLDL Cholesterol Cal: 16 mg/dL (ref 5–40)

## 2022-09-26 LAB — CBC
Hematocrit: 38.2 % (ref 34.0–46.6)
Hemoglobin: 12.8 g/dL (ref 11.1–15.9)
MCH: 30.3 pg (ref 26.6–33.0)
MCHC: 33.5 g/dL (ref 31.5–35.7)
MCV: 91 fL (ref 79–97)
Platelets: 206 10*3/uL (ref 150–450)
RBC: 4.22 x10E6/uL (ref 3.77–5.28)
RDW: 11.6 % — ABNORMAL LOW (ref 11.7–15.4)
WBC: 3.8 10*3/uL (ref 3.4–10.8)

## 2022-09-26 LAB — VITAMIN D 25 HYDROXY (VIT D DEFICIENCY, FRACTURES): Vit D, 25-Hydroxy: 95.8 ng/mL (ref 30.0–100.0)

## 2022-09-26 LAB — B12 AND FOLATE PANEL
Folate: 14.4 ng/mL (ref >3.0)
Vitamin B-12: 283 pg/mL (ref 232–1245)

## 2022-09-26 LAB — TSH: TSH: 2.1 u[IU]/mL (ref 0.450–4.500)

## 2022-09-26 NOTE — Progress Notes (Signed)
All labs are normal and stable.

## 2022-10-01 ENCOUNTER — Encounter: Payer: Self-pay | Admitting: Family Medicine

## 2022-10-01 ENCOUNTER — Ambulatory Visit: Payer: BC Managed Care – PPO | Admitting: Family Medicine

## 2022-10-01 ENCOUNTER — Other Ambulatory Visit (HOSPITAL_COMMUNITY)
Admission: RE | Admit: 2022-10-01 | Discharge: 2022-10-01 | Disposition: A | Discharge status: Home / Self Care | Payer: BC Managed Care – PPO | Source: Ambulatory Visit | Attending: Family Medicine | Admitting: Family Medicine

## 2022-10-01 VITALS — BP 121/76 | HR 58 | Temp 97.8°F | Resp 16 | Wt 122.6 lb

## 2022-10-01 DIAGNOSIS — Z124 Encounter for screening for malignant neoplasm of cervix: Secondary | ICD-10-CM | POA: Diagnosis not present

## 2022-10-01 NOTE — Assessment & Plan Note (Signed)
PAP completed without difficulty; patient without complaints of vaginal irritation or pain. Is married, without children, currently using Teacher, early years/pre for [employer]. Previous PAP history normal. Recommend repeat testing in 5 years given age >30 with HPV co-testing at this time.

## 2022-10-01 NOTE — Progress Notes (Signed)
I,Cassandra Rojas,acting as a Education administrator for Cassandra Sprout, FNP.,have documented all relevant documentation on the behalf of Cassandra Sprout, FNP,as directed by  Cassandra Sprout, FNP while in the presence of Cassandra Sprout, FNP.   Established patient visit  Patient: Cassandra Rojas   DOB: 10-19-1990   32 y.o. Female  MRN: [id number] Visit Date: 10/01/2022  Today's healthcare provider: Gwyneth Sprout, FNP  Re Introduced to nurse practitioner role and practice setting.  All questions answered.  Discussed provider/patient relationship and expectations.  Chief Complaint  Patient presents with   Gynecologic Exam   Subjective    Gynecologic Exam The patient's pertinent negatives include no vaginal discharge. Pertinent negatives include no abdominal pain, chills, fever, nausea or vomiting.    Patient here for pap only. Last pap was done on 12/09/2018. Pap was negative. HPV-negative.  Medications: Outpatient Medications Prior to Visit  Medication Sig   EPINEPHrine 0.3 mg/0.3 mL IJ SOAJ injection Inject 0.3 mLs (0.3 mg total) into the muscle as needed for anaphylaxis.   etonogestrel-ethinyl estradiol (NUVARING) 0.12-0.015 MG/24HR vaginal ring Place 1 each vaginally every 28 (twenty-eight) days. Insert vaginally and leave in place for 3 consecutive weeks, then remove for 1 week.   fexofenadine (ALLEGRA) 180 MG tablet 180 mg daily.   levothyroxine (SYNTHROID) 75 MCG tablet Take 1 tablet (75 mcg total) by mouth daily before breakfast.   No facility-administered medications prior to visit.    Review of Systems  Constitutional:  Negative for chills and fever.  Gastrointestinal:  Negative for abdominal pain, nausea and vomiting.  Genitourinary:  Negative for vaginal discharge.       Objective    BP 121/76 (BP Location: Left Arm, Patient Position: Sitting, Cuff Size: Normal)   Pulse (!) 58   Temp 97.8 F (36.6 C) (Temporal)   Resp 16   Wt 122 lb 9.6 oz (55.6 kg)   LMP 09/11/2022  (Approximate)   BMI 22.42 kg/m  BP Readings from Last 3 Encounters:  10/01/22 121/76  09/25/22 126/80  05/14/22 (!) 123/57   Wt Readings from Last 3 Encounters:  10/01/22 122 lb 9.6 oz (55.6 kg)  09/25/22 124 lb (56.2 kg)  05/14/22 126 lb (57.2 kg)   Physical Exam Exam conducted with a chaperone present.  Constitutional:      General: She is not in acute distress.    Appearance: Normal appearance. She is normal weight. She is not ill-appearing, toxic-appearing or diaphoretic.  Genitourinary:    General: Normal vulva.     Exam position: Lithotomy position.     Pubic Area: Rash present.     Tanner stage (genital): 5.     Labia:        Right: No rash, tenderness, lesion or injury.        Left: No rash, tenderness, lesion or injury.      Urethra: No prolapse, urethral pain, urethral swelling or urethral lesion.     Vagina: Vaginal discharge present.     Cervix: Normal.     Uterus: Normal.      Adnexa: Right adnexa normal and left adnexa normal.       Comments: Dermatitis patch on R posterior gluteal fold Neurological:     Mental Status: She is alert.     No results found for any visits on 10/01/22.  Assessment & Plan   Problem List Items Addressed This Visit       Other   Screening for cervical cancer -  Primary    PAP completed without difficulty; patient without complaints of vaginal irritation or pain. Is married, without children, currently using Teacher, early years/pre for [employer]. Previous PAP history normal. Recommend repeat testing in 5 years given age >30 with HPV co-testing at this time.      Relevant Orders   Cytology - PAP   Return if symptoms worsen or fail to improve.     Vonna Kotyk, FNP, have reviewed all documentation for this visit. The documentation on 10/01/22 for the exam, diagnosis, procedures, and orders are all accurate and complete.  Cassandra Rojas, [place] [phone] (phone) [phone]  (fax)  [place]

## 2022-10-06 LAB — CYTOLOGY - PAP
Adequacy: ABSENT
Chlamydia: NEGATIVE
Comment: NEGATIVE
Comment: NEGATIVE
Comment: NEGATIVE
Comment: NEGATIVE
Comment: NORMAL
Diagnosis: REACTIVE
HSV1: NEGATIVE
HSV2: NEGATIVE
High risk HPV: NEGATIVE
Neisseria Gonorrhea: NEGATIVE
Trichomonas: NEGATIVE

## 2022-10-06 NOTE — Progress Notes (Signed)
Normal PAP; repeat in 5 years.

## 2022-12-03 ENCOUNTER — Other Ambulatory Visit: Payer: Self-pay | Admitting: Family Medicine

## 2022-12-03 DIAGNOSIS — E039 Hypothyroidism, unspecified: Secondary | ICD-10-CM

## 2022-12-10 ENCOUNTER — Encounter: Payer: Self-pay | Admitting: Family Medicine

## 2022-12-10 ENCOUNTER — Ambulatory Visit: Payer: BC Managed Care – PPO | Admitting: Family Medicine

## 2022-12-10 VITALS — BP 115/74 | HR 74 | Temp 97.9°F | Resp 12 | Wt 121.0 lb

## 2022-12-10 DIAGNOSIS — J3089 Other allergic rhinitis: Secondary | ICD-10-CM | POA: Diagnosis not present

## 2022-12-10 DIAGNOSIS — F411 Generalized anxiety disorder: Secondary | ICD-10-CM | POA: Diagnosis not present

## 2022-12-10 MED ORDER — BUSPIRONE HCL 5 MG PO TABS: 5.0000 mg | ORAL_TABLET | Freq: Three times a day (TID) | ORAL | 1 refills | Status: DC | PRN

## 2022-12-10 MED ORDER — HYDROXYZINE HCL 10 MG PO TABS: 10.0000 mg | ORAL_TABLET | Freq: Two times a day (BID) | ORAL | 1 refills | Status: DC | PRN

## 2022-12-10 NOTE — Progress Notes (Signed)
I,Sulibeya S Dimas,acting as a Neurosurgeon for Jacky Kindle, FNP.,have documented all relevant documentation on the behalf of Jacky Kindle, FNP,as directed by  Jacky Kindle, FNP while in the presence of Jacky Kindle, FNP.   Established patient visit  Patient: Cassandra Rojas   DOB: 03-09-1991   32 y.o. Female  MRN: [id number] Visit Date: 12/10/2022  Today's healthcare provider: Jacky Kindle, FNP   Re Introduced to nurse practitioner role and practice setting.  All questions answered.  Discussed provider/patient relationship and expectations.  CC: "I've had some tightness in my throat and chest for a few days and can't get a deep breath off and on - I would like to look into whether it's allergies or thyroid or anxiety related." Patient did a e-visit and was prescribed an inhaler to see if it would help with symptoms. She reports using inhaler did not improve her symptoms.   Subjective    HPI      12/10/2022    8:13 AM  GAD 7 : Generalized Anxiety Score  Nervous, Anxious, on Edge 2  Control/stop worrying 2  Worry too much - different things 2  Trouble relaxing 1  Restless 0  Easily annoyed or irritable 0  Afraid - awful might happen 1  Total GAD 7 Score 8  Anxiety Difficulty Somewhat difficult       12/10/2022    8:13 AM 10/01/2022    8:46 AM 09/25/2022    8:16 AM 05/14/2022    9:30 AM 02/12/2021    8:18 AM  Depression screen PHQ 2/9  Decreased Interest 0 0 0 0 0  Down, Depressed, Hopeless 0 0 0 0 0  PHQ - 2 Score 0 0 0 0 0  Altered sleeping 1 1 2  0 0  Tired, decreased energy 1 1 1 1 2   Change in appetite 1 0 0 0 0  Feeling bad or failure about yourself  0 0 0 0 0  Trouble concentrating 2 0 0 0 0  Moving slowly or fidgety/restless 0 0 0 0 0  Suicidal thoughts 0 0 0 0 0  PHQ-9 Score 5 2 3 1 2   Difficult doing work/chores Somewhat difficult Not difficult at all Not difficult at all Not difficult at all Not difficult at all   Discussed the use of AI scribe software for  clinical note transcription with the patient, who gave verbal consent to proceed.  History of Present Illness   The patient, with a history of allergies, presents with complaints of chest tightness, shortness of breath, and a sensation of pressure on the throat. She has been using an albuterol inhaler for the past week without significant relief. She also reports a sensation of pressure on the throat, described as if someone is placing their hand on the throat. This sensation does not interfere with swallowing but seems to worsen with focus and is associated with feelings of anxiety. The patient denies being around anyone who has been sick or any recent travel. Her husband was recently sick, but she does not believe her symptoms are related. Her anxiety and depression screening scores are elevated compared to a year ago, with difficulty concentrating and changes in appetite noted. She reports increased stress at work and the recent loss of a pet, but does not feel overly stressed. The patient denies palpitations or changes in respiratory pattern, but reports feeling like she can't get a full breath at times. She has been using Flonase for  potential allergies and has noticed persistent nasal congestion on the left side. She is open to trying medication for anxiety, preferably on an as-needed basis.      Medications: Outpatient Medications Prior to Visit  Medication Sig   EPINEPHrine 0.3 mg/0.3 mL IJ SOAJ injection Inject 0.3 mLs (0.3 mg total) into the muscle as needed for anaphylaxis.   etonogestrel-ethinyl estradiol (NUVARING) 0.12-0.015 MG/24HR vaginal ring Place 1 each vaginally every 28 (twenty-eight) days. Insert vaginally and leave in place for 3 consecutive weeks, then remove for 1 week.   fexofenadine (ALLEGRA) 180 MG tablet 180 mg daily.   levothyroxine (SYNTHROID) 75 MCG tablet Take 1 tablet (75 mcg total) by mouth daily before breakfast.   VENTOLIN HFA 108 (90 Base) MCG/ACT inhaler SMARTSIG:2  Puff(s) By Mouth 6 Times Daily PRN   No facility-administered medications prior to visit.    Review of Systems  Constitutional:  Negative for appetite change, chills, fever and unexpected weight change.  HENT:  Positive for congestion and sore throat. Negative for ear pain, postnasal drip, rhinorrhea, sinus pressure, sinus pain and trouble swallowing.   Respiratory:  Positive for chest tightness and shortness of breath. Negative for cough, choking and wheezing.   Cardiovascular:  Negative for chest pain and palpitations.  Gastrointestinal:  Negative for abdominal pain, nausea and vomiting.  Endocrine: Negative for cold intolerance and heat intolerance.  Psychiatric/Behavioral:  The patient is nervous/anxious.        Objective    BP 115/74 (BP Location: Right Arm, Patient Position: Sitting, Cuff Size: Normal)   Pulse 74   Temp 97.9 F (36.6 C) (Temporal)   Resp 12   Wt 121 lb (54.9 kg)   SpO2 100%   BMI 22.13 kg/m    No results found for any visits on 12/10/22.  Assessment & Plan     Chest Tightness and Shortness of Breath: Symptoms not improved with albuterol inhaler. No wheezing or changes in respiratory pattern noted. Physical examination of heart and lungs normal. -Continue monitoring symptoms.  Throat Pressure: Sensation of pressure on the sides of the throat, not preventing swallowing. No palpable cervical lymphadenopathy or thyroid enlargement on physical examination. -Continue monitoring symptoms.  Anxiety: Moderate-high score on anxiety screening. Symptoms seem to be impacting daily life and potentially contributing to physical symptoms. -Start Buspirone 5mg  three times a day as needed. -Start Hydroxyzine 10mg  twice a day as needed. -Consider seeking therapy or counseling, suggested resources include psychologytoday.com, employee assistance programs, and online platforms like BetterHelp.  Allergies: Chronic allergies, recently started Flonase with no noticeable  improvement. -Switch from Allegra to Zyrtec or Claritin to see if symptoms improve.  Follow-up: -Schedule follow-up appointment for 8:20am on July 8th, 2024 to reevaluate symptoms and medication effectiveness.      Leilani Merl, FNP, have reviewed all documentation for this visit. The documentation on 12/10/22 for the exam, diagnosis, procedures, and orders are all accurate and complete.  Jacky Kindle, FNP  [facility] [phone] (phone) [phone] (fax)  [facility]

## 2023-01-12 ENCOUNTER — Ambulatory Visit: Payer: BC Managed Care – PPO | Admitting: Family Medicine

## 2023-01-12 ENCOUNTER — Encounter: Payer: Self-pay | Admitting: Family Medicine

## 2023-01-12 VITALS — BP 114/67 | HR 58 | Ht 62.0 in | Wt 123.8 lb

## 2023-01-12 DIAGNOSIS — Z91018 Allergy to other foods: Secondary | ICD-10-CM

## 2023-01-12 DIAGNOSIS — F411 Generalized anxiety disorder: Secondary | ICD-10-CM | POA: Diagnosis not present

## 2023-01-12 DIAGNOSIS — F40243 Fear of flying: Secondary | ICD-10-CM | POA: Diagnosis not present

## 2023-01-12 MED ORDER — EPINEPHRINE 0.3 MG/0.3ML IJ SOAJ: 0.3000 mg | INTRAMUSCULAR | 1 refills | Status: AC | PRN

## 2023-01-12 NOTE — Progress Notes (Signed)
Established patient visit   Patient: Cassandra Rojas   DOB: 10/01/1990   32 y.o. Female  MRN: [id number] Visit Date: 01/12/2023  Today's healthcare provider: Jacky Kindle, FNP  Re Introduced to nurse practitioner role and practice setting.  All questions answered.  Discussed provider/patient relationship and expectations.  Chief Complaint  Patient presents with   Anxiety    Patient last seen on 12/10/22 and advised to start buspirone 5 mg three times a day as needed and hydroxyzine 10 mg twice a day as needed, as well as to seek therapy or counseling. Patient reports she has not used the buspar but only the hydroxyzine and is tolerating well and made her drowsy so only taking at night. Patient reports her anxiety is mild and improved but not as good as it was 2 weeks. Symptoms reported are difficulty concentrating, dizziness & SOB.    Subjective     HPI     Anxiety    Additional comments: Patient last seen on 12/10/22 and advised to start buspirone 5 mg three times a day as needed and hydroxyzine 10 mg twice a day as needed, as well as to seek therapy or counseling. Patient reports she has not used the buspar but only the hydroxyzine and is tolerating well and made her drowsy so only taking at night. Patient reports her anxiety is mild and improved but not as good as it was 2 weeks. Symptoms reported are difficulty concentrating, dizziness & SOB.       Last edited by Acey Lav, CMA on 01/12/2023  8:22 AM.      Anxiety, Follow-up  She was last seen for anxiety 1 months ago. Changes made at last visit include add .   She reports fair compliance with treatment. She reports fair tolerance of treatment. She is having side effects. Drowsiness with atarax during the day.   She feels her anxiety is moderate and Improved since last visit.  Symptoms: Yes chest pain Yes difficulty concentrating  Yes dizziness No fatigue  No feelings of losing control No insomnia  No  irritable No palpitations  No panic attacks No racing thoughts  No shortness of breath No sweating  No tremors/shakes    GAD-7 Results    01/12/2023    8:22 AM 12/10/2022    8:13 AM  GAD-7 Generalized Anxiety Disorder Screening Tool  1. Feeling Nervous, Anxious, or on Edge 1 2  2. Not Being Able to Stop or Control Worrying 1 2  3. Worrying Too Much About Different Things 1 2  4. Trouble Relaxing 1 1  5. Being So Restless it's Hard To Sit Still 0 0  6. Becoming Easily Annoyed or Irritable 0 0  7. Feeling Afraid As If Something Awful Might Happen 1 1  Total GAD-7 Score 5 8  Difficulty At Work, Home, or Getting  Along With Others? Somewhat difficult Somewhat difficult    PHQ-9 Scores    01/12/2023    8:22 AM 12/10/2022    8:13 AM 10/01/2022    8:46 AM  PHQ9 SCORE ONLY  PHQ-9 Total Score 3 5 2    The patient, with a history of anxiety, presents with difficulty sleeping and intermittent lightheadedness and shortness of breath. They initially tried hydroxyzine twice daily, but felt 'out of it' during the day. They then switched to taking hydroxyzine only at night, which initially improved their sleep. However, over the past week and a half, they report that the hydroxyzine seems  less effective, with difficulty sleeping and intermittent daytime symptoms returning. They deny any new stressors or triggers for their anxiety. They have not yet tried buspirone, which was also prescribed. They also report flight anxiety, particularly during turbulence, and have a flight scheduled at the end of the month. They have a cashew allergy and request a new EpiPen prescription as their current one is expired.  ---------------------------------------------------------------------------------------------------  Medications: Outpatient Medications Prior to Visit  Medication Sig   busPIRone (BUSPAR) 5 MG tablet Take 1 tablet (5 mg total) by mouth 3 (three) times daily as needed.   etonogestrel-ethinyl estradiol  (NUVARING) 0.12-0.015 MG/24HR vaginal ring Place 1 each vaginally every 28 (twenty-eight) days. Insert vaginally and leave in place for 3 consecutive weeks, then remove for 1 week.   fexofenadine (ALLEGRA) 180 MG tablet 180 mg daily.   hydrOXYzine (ATARAX) 10 MG tablet Take 1 tablet (10 mg total) by mouth 2 (two) times daily as needed for anxiety.   levothyroxine (SYNTHROID) 75 MCG tablet Take 1 tablet (75 mcg total) by mouth daily before breakfast.   [DISCONTINUED] EPINEPHrine 0.3 mg/0.3 mL IJ SOAJ injection Inject 0.3 mLs (0.3 mg total) into the muscle as needed for anaphylaxis.   [DISCONTINUED] VENTOLIN HFA 108 (90 Base) MCG/ACT inhaler SMARTSIG:2 Puff(s) By Mouth 6 Times Daily PRN   No facility-administered medications prior to visit.    Review of Systems    Objective    BP 114/67 (BP Location: Right Arm, Patient Position: Sitting, Cuff Size: Normal)   Pulse (!) 58   Ht 5\' 2"  (1.575 m)   Wt 123 lb 12.8 oz (56.2 kg)   SpO2 100%   BMI 22.64 kg/m   Physical Exam Vitals and nursing note reviewed.  Constitutional:      General: She is not in acute distress.    Appearance: Normal appearance. She is normal weight. She is not ill-appearing, toxic-appearing or diaphoretic.  HENT:     Head: Normocephalic and atraumatic.  Cardiovascular:     Rate and Rhythm: Normal rate and regular rhythm.     Pulses: Normal pulses.     Heart sounds: Normal heart sounds. No murmur heard.    No friction rub. No gallop.  Pulmonary:     Effort: Pulmonary effort is normal. No respiratory distress.     Breath sounds: Normal breath sounds. No stridor. No wheezing, rhonchi or rales.  Chest:     Chest wall: No tenderness.  Musculoskeletal:        General: No swelling, tenderness, deformity or signs of injury. Normal range of motion.     Right lower leg: No edema.     Left lower leg: No edema.  Skin:    General: Skin is warm and dry.     Capillary Refill: Capillary refill takes less than 2 seconds.      Coloration: Skin is not jaundiced or pale.     Findings: No bruising, erythema, lesion or rash.  Neurological:     General: No focal deficit present.     Mental Status: She is alert and oriented to person, place, and time. Mental status is at baseline.     Cranial Nerves: No cranial nerve deficit.     Sensory: No sensory deficit.     Motor: No weakness.     Coordination: Coordination normal.  Psychiatric:        Mood and Affect: Mood is anxious.        Behavior: Behavior normal.  Thought Content: Thought content normal.        Judgment: Judgment normal.     No results found for any visits on 01/12/23.  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 Requests a new Rx -Order new EpiPen prescription. Encouraged to seek emergent care if EpiPen is used.      Fear of flying    Chronic; previously untreated. Will try buspar 5 mg at this time to see if symptoms of anxiety improved; if not can consider low dose Valium for flight anxiety if Buspirone is not sufficient. No low dose valium provided at this time.      GAD (generalized anxiety disorder) - Primary    Anxiety: Symptoms of lightheadedness and shortness of breath during periods of relaxation. Hydroxyzine 25mg  at night initially helpful but efficacy seems to have decreased over time. Discussed the possibility of adding buspirone during the day or switching to a daily SSRI or SNRI. -Continue Hydroxyzine 10 mg at night; could try 1/2 tablet 5 mg during the day to see if symptoms previously noted return. -Start Buspirone 5mg  during the day, can increase to 5 mg 2x/day if tolerated and needed. Continues to work on self care and deep breathing to assist medications. Vitals unchanged.       Follow-up: Via MyChart messaging to discuss efficacy of new medication regimen and potential need for Valium for flight  anxiety.  Leilani Merl, FNP, have reviewed all documentation for this visit. The documentation on 01/12/23 for the exam, diagnosis, procedures, and orders are all accurate and complete.  Jacky Kindle, FNP  [facility] [phone] (phone) [phone] (fax)  [facility]

## 2023-01-12 NOTE — Assessment & Plan Note (Addendum)
Anxiety: Symptoms of lightheadedness and shortness of breath during periods of relaxation. Hydroxyzine 25mg  at night initially helpful but efficacy seems to have decreased over time. Discussed the possibility of adding buspirone during the day or switching to a daily SSRI or SNRI. -Continue Hydroxyzine 10 mg at night; could try 1/2 tablet 5 mg during the day to see if symptoms previously noted return. -Start Buspirone 5mg  during the day, can increase to 5 mg 2x/day if tolerated and needed. Continues to work on self care and deep breathing to assist medications. Vitals unchanged.

## 2023-01-12 NOTE — Assessment & Plan Note (Signed)
Chronic; previously untreated. Will try buspar 5 mg at this time to see if symptoms of anxiety improved; if not can consider low dose Valium for flight anxiety if Buspirone is not sufficient. No low dose valium provided at this time.

## 2023-01-12 NOTE — Assessment & Plan Note (Signed)
Chronic; stable Notes Cashew Allergy with an Expired EpiPens. Requests a new Rx -Order new EpiPen prescription. Encouraged to seek emergent care if EpiPen is used.

## 2023-01-27 DIAGNOSIS — G43109 Migraine with aura, not intractable, without status migrainosus: Secondary | ICD-10-CM | POA: Diagnosis not present

## 2023-01-27 DIAGNOSIS — H33322 Round hole, left eye: Secondary | ICD-10-CM | POA: Diagnosis not present

## 2023-02-06 DIAGNOSIS — J029 Acute pharyngitis, unspecified: Secondary | ICD-10-CM | POA: Diagnosis not present

## 2023-02-06 DIAGNOSIS — R509 Fever, unspecified: Secondary | ICD-10-CM | POA: Diagnosis not present

## 2023-02-06 DIAGNOSIS — Z20822 Contact with and (suspected) exposure to covid-19: Secondary | ICD-10-CM | POA: Diagnosis not present

## 2023-03-07 ENCOUNTER — Other Ambulatory Visit: Payer: Self-pay | Admitting: Family Medicine

## 2023-03-07 DIAGNOSIS — F411 Generalized anxiety disorder: Secondary | ICD-10-CM

## 2023-03-10 NOTE — Telephone Encounter (Signed)
Requested Prescriptions  Pending Prescriptions Disp Refills   hydrOXYzine (ATARAX) 10 MG tablet [Pharmacy Med Name: HYDROXYZINE HCL 10MG  TABLETS] 60 tablet 1    Sig: TAKE 1 TABLET(10 MG) BY MOUTH TWICE DAILY AS NEEDED FOR ANXIETY     Ear, Nose, and Throat:  Antihistamines 2 Passed - 03/07/2023  3:11 PM      Passed - Cr in normal range and within 360 days    Creatinine, Ser  Date Value Ref Range Status  09/25/2022 0.89 0.57 - 1.00 mg/dL Final         Passed - Valid encounter within last 12 months    Recent Outpatient Visits           1 month ago GAD (generalized anxiety disorder)   [place] [facility] Merita Norton T, FNP   3 months ago GAD (generalized anxiety disorder)   [place] [facility] Jacky Kindle, FNP   5 months ago Screening for cervical cancer   [place] [facility] Merita Norton T, FNP   5 months ago Hypothyroidism, unspecified type   [facility] Jacky Kindle, FNP   10 months ago Impetigo   [facility] Jacky Kindle, [place]

## 2023-05-19 DIAGNOSIS — H33322 Round hole, left eye: Secondary | ICD-10-CM | POA: Diagnosis not present

## 2023-05-19 DIAGNOSIS — G43109 Migraine with aura, not intractable, without status migrainosus: Secondary | ICD-10-CM | POA: Diagnosis not present

## 2023-08-12 ENCOUNTER — Ambulatory Visit: Payer: BC Managed Care – PPO | Admitting: Physician Assistant

## 2023-08-12 ENCOUNTER — Encounter: Payer: Self-pay | Admitting: Physician Assistant

## 2023-08-12 VITALS — BP 134/77 | HR 66 | Resp 16 | Wt 128.0 lb

## 2023-08-12 DIAGNOSIS — E039 Hypothyroidism, unspecified: Secondary | ICD-10-CM | POA: Diagnosis not present

## 2023-08-12 DIAGNOSIS — F411 Generalized anxiety disorder: Secondary | ICD-10-CM | POA: Diagnosis not present

## 2023-08-12 DIAGNOSIS — R030 Elevated blood-pressure reading, without diagnosis of hypertension: Secondary | ICD-10-CM | POA: Diagnosis not present

## 2023-08-12 DIAGNOSIS — Z23 Encounter for immunization: Secondary | ICD-10-CM

## 2023-08-12 NOTE — Progress Notes (Signed)
 Established patient visit  Patient: Cassandra Rojas   DOB: 12-26-1990   33 y.o. Female  MRN: [id number] Visit Date: 08/12/2023  Today's healthcare provider: Jolynn Spencer, PA-C   Chief Complaint  Patient presents with   Medical Management of Chronic Issues    Thyroid    Subjective     Discussed the use of AI scribe software for clinical note transcription with the patient, who gave verbal consent to proceed.  History of Present Illness   The patient, with a history of hypothyroidism, presents for a thyroid  check-up. She has been taking levothyroxine  regularly and is interested in getting blood work done to ensure the dosage is appropriate. The last labs were done a year ago, and the TSH was normal. She has about two weeks of levothyroxine  left.  The patient's blood pressure was found to be slightly elevated during the visit, which is unusual for her. She is unsure of her usual blood pressure readings but does not recall it being high in the past. She had a stressful day at work before the visit, which could potentially be contributing to the elevated reading.  The patient also has a history of anxiety, which has improved. She is not currently taking any medication for it but is seeing a therapist occasionally. She also reports a family history of heart disease.  The patient has been using the NuvaRing for several years and has been using it to skip her periods for about five or six months. She reports having dry skin and getting cold easily, especially her feet. She also reports a feeling of pressure around her neck last year, which was not associated with a palpable lump.  The patient also has a small tear in her retina, which was discovered during an eye exam in November. She is due for a follow-up in a year.           08/12/2023    3:37 PM 01/12/2023    8:22 AM 12/10/2022    8:13 AM  Depression screen PHQ 2/9  Decreased Interest 1 0 0  Down, Depressed, Hopeless 1 0 0  PHQ - 2  Score 2 0 0  Altered sleeping 2 1 1   Tired, decreased energy 1 1 1   Change in appetite 0 0 1  Feeling bad or failure about yourself  0 0 0  Trouble concentrating 1 1 2   Moving slowly or fidgety/restless 0 0 0  Suicidal thoughts 0 0 0  PHQ-9 Score 6 3 5   Difficult doing work/chores Not difficult at all Somewhat difficult Somewhat difficult      08/12/2023    3:37 PM 01/12/2023    8:22 AM 12/10/2022    8:13 AM  GAD 7 : Generalized Anxiety Score  Nervous, Anxious, on Edge 1 1 2   Control/stop worrying 1 1 2   Worry too much - different things 1 1 2   Trouble relaxing 1 1 1   Restless 1 0 0  Easily annoyed or irritable 0 0 0  Afraid - awful might happen 1 1 1   Total GAD 7 Score 6 5 8   Anxiety Difficulty Not difficult at all Somewhat difficult Somewhat difficult    Medications: Outpatient Medications Prior to Visit  Medication Sig   EPINEPHrine  0.3 mg/0.3 mL IJ SOAJ injection Inject 0.3 mg into the muscle as needed for anaphylaxis.   etonogestrel -ethinyl estradiol  (NUVARING) 0.12-0.015 MG/24HR vaginal ring Place 1 each vaginally every 28 (twenty-eight) days. Insert vaginally and leave in place for 3 consecutive weeks, then  remove for 1 week.   fexofenadine (ALLEGRA) 180 MG tablet 180 mg daily.   hydrOXYzine  (ATARAX ) 10 MG tablet TAKE 1 TABLET(10 MG) BY MOUTH TWICE DAILY AS NEEDED FOR ANXIETY   levothyroxine  (SYNTHROID ) 75 MCG tablet Take 1 tablet (75 mcg total) by mouth daily before breakfast.   busPIRone  (BUSPAR ) 5 MG tablet Take 1 tablet (5 mg total) by mouth 3 (three) times daily as needed.   No facility-administered medications prior to visit.    Review of Systems All negative Except see HPI       Objective    BP 134/77 (BP Location: Left Arm, Patient Position: Sitting, Cuff Size: Normal)   Pulse 66   Resp 16   Wt 128 lb (58.1 kg)   BMI 23.41 kg/m     Physical Exam Vitals reviewed.  Constitutional:      General: She is not in acute distress.    Appearance: Normal  appearance. She is well-developed. She is not diaphoretic.  HENT:     Head: Normocephalic and atraumatic.  Eyes:     General: No scleral icterus.    Conjunctiva/sclera: Conjunctivae normal.  Neck:     Thyroid : No thyromegaly.  Cardiovascular:     Rate and Rhythm: Normal rate and regular rhythm.     Pulses: Normal pulses.     Heart sounds: Normal heart sounds. No murmur heard. Pulmonary:     Effort: Pulmonary effort is normal. No respiratory distress.     Breath sounds: Normal breath sounds. No wheezing, rhonchi or rales.  Musculoskeletal:     Cervical back: Neck supple.     Right lower leg: No edema.     Left lower leg: No edema.  Lymphadenopathy:     Cervical: No cervical adenopathy.  Skin:    General: Skin is warm and dry.     Findings: No rash.  Neurological:     Mental Status: She is alert and oriented to person, place, and time. Mental status is at baseline.  Psychiatric:        Mood and Affect: Mood normal.        Behavior: Behavior normal.      No results found for any visits on 08/12/23.      Assessment and Plan    Hypothyroidism chronic On Levothyroxine , last TSH was normal a year ago. Patient has two weeks of medication left. -Order TSH and T4 labs today to ensure appropriate dosing. -Plan to prescribe Levothyroxine  once lab results are reviewed.  Elevated bp reaing Blood pressure slightly elevated at today's visit, but patient reports a stressful day. No previous history of hypertension. -Advise patient to purchase a blood pressure cuff and monitor blood pressure daily for two weeks, alternating between morning and evening readings. -Advise low salt and low spice diet. -Plan for follow-up in two weeks to review blood pressure readings.  Anxiety Patient reports improvement with therapy and is not currently on medication. -Open discussion about potential future need for low-dose medication if anxiety increases.  Thyroid  Nodule? Patient reports feeling  pressure in the neck last year, but no lump palpated. No ultrasound done previously. -Consider future thyroid  ultrasound for baseline if patient feels it is necessary.  General Health Maintenance -Administer flu vaccine today. -Continue NuvaRing for menstrual regulation. Patient to message when refills are needed.     Immunization due (Primary) - Flu vaccine trivalent PF, 6mos and older(Flulaval,Afluria,Fluarix,Fluzone)   Orders Placed This Encounter  Procedures   Flu vaccine trivalent PF, 6mos and older(Flulaval,Afluria,Fluarix,Fluzone)  T4, free   TSH    No follow-ups on file.   The patient was advised to call back or seek an in-person evaluation if the symptoms worsen or if the condition fails to improve as anticipated.  I discussed the assessment and treatment plan with the patient. The patient was provided an opportunity to ask questions and all were answered. The patient agreed with the plan and demonstrated an understanding of the instructions.  I, Emmagene Ortner, PA-C have reviewed all documentation for this visit. The documentation on 08/12/2023  for the exam, diagnosis, procedures, and orders are all accurate and complete.  Jolynn Spencer, [facility], MMS [facility] [phone] (phone) [phone] (fax)  [facility]

## 2023-08-14 DIAGNOSIS — E039 Hypothyroidism, unspecified: Secondary | ICD-10-CM | POA: Diagnosis not present

## 2023-08-15 LAB — T4, FREE: Free T4: 1.37 ng/dL (ref 0.82–1.77)

## 2023-08-15 LAB — TSH: TSH: 3.25 u[IU]/mL (ref 0.450–4.500)

## 2023-08-16 ENCOUNTER — Encounter: Payer: Self-pay | Admitting: Physician Assistant

## 2023-08-27 ENCOUNTER — Other Ambulatory Visit: Payer: Self-pay

## 2023-08-27 ENCOUNTER — Telehealth: Payer: Self-pay | Admitting: Physician Assistant

## 2023-08-27 MED ORDER — LEVOTHYROXINE SODIUM 75 MCG PO TABS: 75.0000 ug | ORAL_TABLET | Freq: Every day | ORAL | 3 refills | Status: AC

## 2023-08-27 NOTE — Telephone Encounter (Signed)
[facility] faxed refill request for the following medications:   levothyroxine (SYNTHROID) 75 MCG tablet   Please advise.  

## 2023-08-28 ENCOUNTER — Encounter: Payer: Self-pay | Admitting: Physician Assistant

## 2023-08-28 DIAGNOSIS — R030 Elevated blood-pressure reading, without diagnosis of hypertension: Secondary | ICD-10-CM

## 2023-08-31 MED ORDER — BLOOD PRESSURE KIT DEVI: 0 refills | Status: AC

## 2023-09-14 ENCOUNTER — Ambulatory Visit: Payer: BC Managed Care – PPO | Admitting: Physician Assistant

## 2023-10-16 ENCOUNTER — Encounter: Payer: Self-pay | Admitting: Physician Assistant

## 2023-10-19 ENCOUNTER — Other Ambulatory Visit: Payer: Self-pay

## 2023-10-19 DIAGNOSIS — Z3044 Encounter for surveillance of vaginal ring hormonal contraceptive device: Secondary | ICD-10-CM

## 2023-10-19 MED ORDER — ETONOGESTREL-ETHINYL ESTRADIOL 0.12-0.015 MG/24HR VA RING: 1.0000 | VAGINAL_RING | VAGINAL | 12 refills | Status: AC

## 2023-11-23 ENCOUNTER — Telehealth: Payer: Self-pay | Admitting: Physician Assistant

## 2023-11-23 ENCOUNTER — Other Ambulatory Visit: Payer: Self-pay

## 2023-11-23 NOTE — Telephone Encounter (Signed)
Converted to refill request 

## 2023-11-23 NOTE — Telephone Encounter (Signed)
[facility] faxed refill request for the following medications:   levothyroxine (SYNTHROID) 75 MCG tablet   Please advise.  

## 2023-12-01 ENCOUNTER — Encounter: Payer: Self-pay | Admitting: Physician Assistant

## 2023-12-01 NOTE — Telephone Encounter (Signed)
 Called and spoke to the pt, its been explained she still has 3 refills left on this medication she needs to reach out to the pharmacy, its been advised if she has any issues to reach back out to the clinic, she verbally stated she understood.

## 2024-06-05 DIAGNOSIS — J069 Acute upper respiratory infection, unspecified: Secondary | ICD-10-CM | POA: Diagnosis not present

## 2024-06-05 DIAGNOSIS — R059 Cough, unspecified: Secondary | ICD-10-CM | POA: Diagnosis not present

## 2024-06-05 DIAGNOSIS — R070 Pain in throat: Secondary | ICD-10-CM | POA: Diagnosis not present
